# Patient Record
Sex: Female | Born: 1960 | Race: White | Hispanic: No | Marital: Married | Smoking: Never smoker
Health system: Southern US, Community
[De-identification: ages and names within clinical notes are randomized; demographics above are authoritative.]

## PROBLEM LIST (undated history)

## (undated) DIAGNOSIS — K635 Polyp of colon: Secondary | ICD-10-CM

## (undated) DIAGNOSIS — Z8719 Personal history of other diseases of the digestive system: Secondary | ICD-10-CM

## (undated) DIAGNOSIS — K219 Gastro-esophageal reflux disease without esophagitis: Secondary | ICD-10-CM

## (undated) DIAGNOSIS — K209 Esophagitis, unspecified without bleeding: Secondary | ICD-10-CM

## (undated) DIAGNOSIS — K9041 Non-celiac gluten sensitivity: Secondary | ICD-10-CM

## (undated) DIAGNOSIS — E079 Disorder of thyroid, unspecified: Secondary | ICD-10-CM

## (undated) DIAGNOSIS — M7540 Impingement syndrome of unspecified shoulder: Secondary | ICD-10-CM

## (undated) DIAGNOSIS — M1990 Unspecified osteoarthritis, unspecified site: Secondary | ICD-10-CM

## (undated) DIAGNOSIS — E039 Hypothyroidism, unspecified: Secondary | ICD-10-CM

## (undated) DIAGNOSIS — T7840XA Allergy, unspecified, initial encounter: Secondary | ICD-10-CM

## (undated) DIAGNOSIS — D649 Anemia, unspecified: Secondary | ICD-10-CM

## (undated) DIAGNOSIS — R87629 Unspecified abnormal cytological findings in specimens from vagina: Secondary | ICD-10-CM

## (undated) HISTORY — DX: Esophagitis, unspecified without bleeding: K20.90

## (undated) HISTORY — PX: COLONOSCOPY: SHX174

## (undated) HISTORY — DX: Unspecified abnormal cytological findings in specimens from vagina: R87.629

## (undated) HISTORY — DX: Allergy, unspecified, initial encounter: T78.40XA

## (undated) HISTORY — DX: Polyp of colon: K63.5

## (undated) HISTORY — PX: APPENDECTOMY: SHX54

## (undated) HISTORY — DX: Disorder of thyroid, unspecified: E07.9

## (undated) HISTORY — DX: Anemia, unspecified: D64.9

## (undated) HISTORY — DX: Non-celiac gluten sensitivity: K90.41

## (undated) HISTORY — DX: Impingement syndrome of unspecified shoulder: M75.40

## (undated) HISTORY — PX: OTHER SURGICAL HISTORY: SHX169

## (undated) HISTORY — DX: Esophagitis, unspecified: K20.9

---

## 1995-08-17 HISTORY — PX: DILATION AND CURETTAGE OF UTERUS: SHX78

## 1997-08-16 HISTORY — PX: OTHER SURGICAL HISTORY: SHX169

## 2003-11-27 ENCOUNTER — Other Ambulatory Visit: Admission: RE | Admit: 2003-11-27 | Discharge: 2003-11-27 | Payer: Self-pay | Admitting: Family Medicine

## 2003-12-03 ENCOUNTER — Emergency Department (HOSPITAL_COMMUNITY): Admission: EM | Admit: 2003-12-03 | Discharge: 2003-12-03 | Payer: Self-pay | Admitting: Emergency Medicine

## 2003-12-12 ENCOUNTER — Other Ambulatory Visit: Admission: RE | Admit: 2003-12-12 | Discharge: 2003-12-12 | Payer: Self-pay | Admitting: Obstetrics and Gynecology

## 2004-01-08 ENCOUNTER — Encounter (HOSPITAL_COMMUNITY): Admission: RE | Admit: 2004-01-08 | Discharge: 2004-02-07 | Payer: Self-pay | Admitting: Family Medicine

## 2004-01-15 ENCOUNTER — Encounter (INDEPENDENT_AMBULATORY_CARE_PROVIDER_SITE_OTHER): Payer: Self-pay | Admitting: *Deleted

## 2004-01-15 LAB — CONVERTED CEMR LAB

## 2004-02-11 ENCOUNTER — Encounter (HOSPITAL_COMMUNITY): Admission: RE | Admit: 2004-02-11 | Discharge: 2004-03-12 | Payer: Self-pay | Admitting: Family Medicine

## 2004-02-15 ENCOUNTER — Ambulatory Visit (HOSPITAL_COMMUNITY): Admission: RE | Admit: 2004-02-15 | Discharge: 2004-02-15 | Payer: Self-pay | Admitting: Orthopedic Surgery

## 2004-03-07 ENCOUNTER — Ambulatory Visit (HOSPITAL_COMMUNITY): Admission: RE | Admit: 2004-03-07 | Discharge: 2004-03-07 | Payer: Self-pay | Admitting: Orthopaedic Surgery

## 2004-05-04 ENCOUNTER — Encounter: Admission: RE | Admit: 2004-05-04 | Discharge: 2004-05-04 | Payer: Self-pay | Admitting: Orthopaedic Surgery

## 2004-05-09 ENCOUNTER — Emergency Department (HOSPITAL_COMMUNITY): Admission: EM | Admit: 2004-05-09 | Discharge: 2004-05-09 | Payer: Self-pay | Admitting: Emergency Medicine

## 2004-05-27 ENCOUNTER — Encounter
Admission: RE | Admit: 2004-05-27 | Discharge: 2004-05-27 | Payer: Self-pay | Admitting: Physical Medicine and Rehabilitation

## 2004-06-17 ENCOUNTER — Ambulatory Visit: Payer: Self-pay | Admitting: Family Medicine

## 2004-07-08 ENCOUNTER — Ambulatory Visit: Payer: Self-pay | Admitting: Internal Medicine

## 2004-07-13 ENCOUNTER — Ambulatory Visit: Payer: Self-pay | Admitting: Internal Medicine

## 2004-07-17 ENCOUNTER — Ambulatory Visit: Payer: Self-pay | Admitting: Family Medicine

## 2004-08-06 ENCOUNTER — Ambulatory Visit: Payer: Self-pay | Admitting: Sports Medicine

## 2004-08-20 ENCOUNTER — Ambulatory Visit: Payer: Self-pay | Admitting: Family Medicine

## 2004-10-06 ENCOUNTER — Ambulatory Visit: Payer: Self-pay | Admitting: Sports Medicine

## 2006-10-13 DIAGNOSIS — M545 Low back pain, unspecified: Secondary | ICD-10-CM | POA: Insufficient documentation

## 2006-10-13 DIAGNOSIS — K209 Esophagitis, unspecified without bleeding: Secondary | ICD-10-CM | POA: Insufficient documentation

## 2006-10-14 ENCOUNTER — Encounter (INDEPENDENT_AMBULATORY_CARE_PROVIDER_SITE_OTHER): Payer: Self-pay | Admitting: *Deleted

## 2008-04-01 ENCOUNTER — Ambulatory Visit: Payer: Self-pay | Admitting: Family Medicine

## 2008-04-01 DIAGNOSIS — Z862 Personal history of diseases of the blood and blood-forming organs and certain disorders involving the immune mechanism: Secondary | ICD-10-CM | POA: Insufficient documentation

## 2008-05-01 ENCOUNTER — Encounter (INDEPENDENT_AMBULATORY_CARE_PROVIDER_SITE_OTHER): Payer: Self-pay | Admitting: Family Medicine

## 2008-05-01 ENCOUNTER — Ambulatory Visit: Payer: Self-pay | Admitting: Family Medicine

## 2008-05-01 LAB — CONVERTED CEMR LAB
Cholesterol: 184 mg/dL (ref 0–200)
HCT: 39.2 % (ref 36.0–46.0)
HDL: 58 mg/dL (ref >39)
Hemoglobin: 12.1 g/dL (ref 12.0–15.0)
LDL Cholesterol: 105 mg/dL — ABNORMAL HIGH (ref 0–99)
MCHC: 30.9 g/dL (ref 30.0–36.0)
MCV: 79.7 fL (ref 78.0–100.0)
Platelets: 219 10*3/uL (ref 150–400)
RBC: 4.92 M/uL (ref 3.87–5.11)
RDW: 17.6 % — ABNORMAL HIGH (ref 11.5–15.5)
Total CHOL/HDL Ratio: 3.2
Triglycerides: 105 mg/dL (ref <150)
VLDL: 21 mg/dL (ref 0–40)
Vit D, 1,25-Dihydroxy: 34 (ref 30–89)
WBC: 5.3 10*3/uL (ref 4.0–10.5)

## 2008-05-03 ENCOUNTER — Encounter (INDEPENDENT_AMBULATORY_CARE_PROVIDER_SITE_OTHER): Payer: Self-pay | Admitting: *Deleted

## 2008-05-06 ENCOUNTER — Encounter (INDEPENDENT_AMBULATORY_CARE_PROVIDER_SITE_OTHER): Payer: Self-pay | Admitting: *Deleted

## 2008-06-11 ENCOUNTER — Ambulatory Visit: Payer: Self-pay | Admitting: Internal Medicine

## 2008-06-27 ENCOUNTER — Ambulatory Visit: Payer: Self-pay | Admitting: Internal Medicine

## 2008-06-27 ENCOUNTER — Encounter: Payer: Self-pay | Admitting: Internal Medicine

## 2008-06-28 ENCOUNTER — Encounter: Payer: Self-pay | Admitting: Internal Medicine

## 2008-07-23 ENCOUNTER — Ambulatory Visit (HOSPITAL_COMMUNITY): Admission: RE | Admit: 2008-07-23 | Discharge: 2008-07-23 | Payer: Self-pay | Admitting: Family Medicine

## 2010-03-23 ENCOUNTER — Ambulatory Visit (HOSPITAL_COMMUNITY): Admission: RE | Admit: 2010-03-23 | Discharge: 2010-03-23 | Payer: Self-pay | Admitting: Family Medicine

## 2010-09-05 ENCOUNTER — Encounter: Payer: Self-pay | Admitting: Physical Medicine and Rehabilitation

## 2010-09-15 NOTE — Letter (Signed)
Summary: Patient Notice- Polyp Results   Gastroenterology  [address] [place], [place] [address]   Phone: [phone]  Fax: [phone]        June 28, 2008 MRN: [id number]    Leah Cuevas [address] [place], [place]  [address]    Dear [name],  I am pleased to inform you that the colon polyp(s) removed during your recent colonoscopy was (were) found to be benign (no cancer detected) upon pathologic examination.The polyp was hyperplastic ( not precancerous)  I recommend you have a repeat colonoscopy examination in 7_ years to look for recurrent polyps, as having colon polyps increases your risk for having recurrent polyps or even colon cancer in the future.  Should you develop new or worsening symptoms of abdominal pain, bowel habit changes or bleeding from the rectum or bowels, please schedule an evaluation with either your primary care physician or with me.  Additional information/recommendations:  _x_ No further action with gastroenterology is needed at this time. Please      follow-up with your primary care physician for your other healthcare      needs.    Please call us if you are having persistent problems or have questions about your condition that have not been fully answered at this time.  Sincerely,  Hart Carwin MD  This letter has been electronically signed by your physician.

## 2010-09-15 NOTE — Letter (Signed)
Summary: Lipid Letter  [facility]  [address]   [place], [place] [address]   Phone: [phone]  Fax: [phone]    05/03/2008  Leah Cuevas [address] [place], [place]  [address]  Dear Leah Cuevas:  Your lab results from 05/01/08 are excellent. Your cholesterol looks great. The lab values are below. Your hemoglobin was 12.1 so you are not anemic and your vitamin D level was about 34 which is normal. Please call our office if you have any questions.      Cholesterol:       184     Goal: <200   HDL "good" Cholesterol:   58     Goal: >40   LDL "bad" Cholesterol:   105     Goal: <160   Triglycerides:       105     Goal: <150  Sincerely,    [facility] JENNIFER HOBBS MD  Appended Document: Lipid Letter Letter sent via mail to pt ..................Marland KitchenDelores Pate-Gaddy, CMA (AAMA)

## 2010-09-15 NOTE — Assessment & Plan Note (Signed)
Summary: pap smear    Vital Signs:  Patient Profile:   50 Years Old Female Height:     64.5 inches (163.83 cm) Weight:      130 pounds (59.09 kg) BMI:     22.05 Temp:     97.7 degrees F (36.50 degrees C) Pulse rate:   50 / minute BP sitting:   116 / 75  Pt. in pain?   no  Vitals Entered By: Jone Baseman CMA (May 01, 2008 11:26 AM)                 Flu Vaccine Next Due:  Refused Last TD:  Done. (08/16/1997 12:00:00 AM) TD Next Due:  Refused   PCP:  Victorino Dike HOBBS MD  Chief Complaint:  pap smear .  History of Present Illness: 50 yo female with hx degenerative disc disease, GERD, Depression who mainly chooses to use homeopathic medication here today for pap since unable to get at [facility].  Needs colonoscopy, mammogram, and pap. Now has [employer].   Family situation seems better. Not as stressed. Still living at friend's house in cam[place].  LMP was at last visit 04/01/08. Has irregular menses. Sometimes will go every 3 months. No heavy periods. Does not want STD testing.   Having some bad breath - Unable to see dentist because does not have insurance. Is going to try to go to dental clinic [facility].   Wants vitamin D level checked becuse she has been taking vitamin D supplements and feels like she may be taking too much.        Current Allergies: PENICILLIN      Physical Exam  General:     alert, well-developed, and well-nourished.   Abdomen:     soft and non-tender.   Genitalia:     Normal introitus for age, no external lesions, no vaginal discharge, mucosa pink and moist, no vaginal or cervical lesions, no vaginal atrophy, no friaility or hemorrhage, normal uterus size and position, no adnexal masses or tenderness    Impression & Recommendations:  Problem # 1:  SCREENING FOR MALIGNANT NEOPLASM(ICD-V76.2) Assessment: Unchanged pap done today.   Orders: Pap Smear- [facility] (Pap) [facility]- Est Level  3 ([address])   Problem # 2:  GYNECOLOGICAL  EXAMINATIONOUTINE (ICD-V72.31) Assessment: Unchanged  Problem # 3:  SCREENING FOR LIPOID DISORDERS (ICD-V77.91) Assessment: Unchanged Checking FLP.   Orders: Lipid-[facility] ([address]-32440)   Problem # 4:  Hx of ANEMIA, HX OF (ICD-V12.3) Assessment: Unchanged Check hemoglobin.   Orders: CBC-[facility] ([address]272)   Other Orders: Vit D, 25 OH-[facility] ([id number])    ]
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
## 2010-09-15 NOTE — Miscellaneous (Signed)
Summary: [facility] Previsit/prep  Clinical Lists Changes  Medications: Added new medication of COLYTE WITH FLAVOR PACKS 240 GM  SOLR (PEG 3350-KCL-NABCB-NACL-NASULF) As per prep instructions. - Signed Added new medication of DULCOLAX 5 MG  TBEC (BISACODYL) Day before procedure take 2 at 3pm and 2 at 8pm. - Signed Rx of COLYTE WITH FLAVOR PACKS 240 GM  SOLR (PEG 3350-KCL-NABCB-NACL-NASULF) As per prep instructions.;  #1 x 0;  Signed;  Entered by: Wyona Almas RN;  Authorized by: Hart Carwin MD;  Method used: [facility] to [facility] [phone]*, [address] [place], [place], [place]  [address], Ph: [phone] or [phone], Fax: [phone] Rx of DULCOLAX 5 MG  TBEC (BISACODYL) Day before procedure take 2 at 3pm and 2 at 8pm.;  #4 x 0;  Signed;  Entered by: Wyona Almas RN;  Authorized by: Hart Carwin MD;  Method used: Electronically to [facility] [phone]*, [address], [place], [place], [place]  [address], Ph: [phone] or [phone], Fax: [phone] Observations: Added new observation of ALLERGY REV: Done (06/11/2008 8:34)    Prescriptions: DULCOLAX 5 MG  TBEC (BISACODYL) Day before procedure take 2 at 3pm and 2 at 8pm.  #4 x 0   Entered by:   Wyona Almas RN   Authorized by:   Hart Carwin MD   Signed by:   Wyona Almas RN on 06/11/2008   Method used:   [facility]        [facility] [address] [phone]* (retail)       [address]       [place], [place]  [address]       Ph: [phone] or [phone]       Fax: [phone]   RxID:   [id number] COLYTE WITH FLAVOR PACKS 240 GM  SOLR (PEG 3350-KCL-NABCB-NACL-NASULF) As per prep instructions.  #1 x 0   Entered by:   Wyona Almas RN   Authorized by:   Hart Carwin MD   Signed by:   Wyona Almas RN on 06/11/2008   Method used:   Electronically to        [facility] [phone]* (retail)       [address]       [place], [place]  [address]       Ph:  [phone] or [phone]       Fax: [phone]   RxID:   [id number]

## 2010-09-15 NOTE — Procedures (Signed)
Summary: Colonoscopy   Colonoscopy  Procedure date:  06/27/2008  Findings:      Location:  [facility].    Procedures Next Due Date:    Colonoscopy: 07/2015  Patient Name: Leah, Cuevas. MRN:  Procedure Procedures: Colonoscopy CPT: [phone].  Personnel: Endoscopist: Kattia Selley L. Juanda Chance, MD.  Exam Location: Exam performed in Outpatient Clinic. Outpatient  Patient Consent: Procedure, Alternatives, Risks and Benefits discussed, consent obtained, from patient. Consent was obtained by the RN.  Indications  Surveillance of: Adenomatous Polyp(s). Initial polypectomy was performed in 2005. 1-2 Polyps were found at Index Exam. 2005.  History  Current Medications: Patient is not currently taking Coumadin.  Pre-Exam Physical: Performed Jul 13, 2004. Entire physical exam was normal.  Comments: Pt. history reviewed/updated, physical exam performed prior to initiation of sedation?yes Exam Exam: Extent of exam reached: Cecum, extent intended: Cecum.  The cecum was identified by appendiceal orifice and IC valve. Duration of exam: time in9:40 min, out 5:58 minj minutes. Colon retroflexion performed. Images taken. ASA Classification: I. Tolerance: good.  Monitoring: Pulse and BP monitoring, Oximetry used. Supplemental O2 given.  Colon Prep Used Halflytely for colon prep. Prep results: good.  Sedation Meds: Patient assessed and found to be appropriate for moderate (conscious) sedation. Fentanyl 100 mcg. given IV. Versed 10 mg. given IV.  Findings - NORMAL EXAM: Cecum.  POLYP: Sigmoid Colon, Maximum size: 5 mm. sessile polyp. Distance from Anus 20 cm. Procedure:  biopsy without cautery, The polyp was removed piece meal. removed, retrieved, Polyp sent to pathology. ICD9: Colon Polyps: 211.3.  - NORMAL EXAM: Rectum.   Assessment Abnormal examination, see findings above.  Diagnoses: 211.3: Colon Polyps.   Comments: diminutive sigmoid polyp removed from 20  cm Events  Unplanned Interventions: No intervention was required.  Unplanned Events: There were no complications. Plans Medication Plan: Await pathology.  Patient Education: Patient given standard instructions for: Patient instructed to get routine colonoscopy every 5-7 years.  Disposition: After procedure patient sent to recovery. After recovery patient sent home.    cc: Alanda Amass, MD   REPORT OF SURGICAL PATHOLOGY   Case #: [id number] Patient Name: Leah Cuevas, Leah Cuevas. Office Chart Number:  [id number]   MRN: [id number] Pathologist: Beulah Gandy. Luisa Hart, MD DOB/Age  11-16-1960 (Age: 50)    Gender: F Date Taken:  06/27/2008 Date Received: 06/27/2008   FINAL DIAGNOSIS   ***MICROSCOPIC EXAMINATION AND DIAGNOSIS***   COLON, 20 CM, POLYP:  HYPERPLASTIC POLYPS.  NO ADENOMATOUS CHANGE OR MALIGNANCY IDENTIFIED.   cc Date Reported:  06/28/2008     Beulah Gandy. Luisa Hart, MD   June 28, 2008 MRN: [id number]  [facility] [address] [place], [place]  [address]  Dear Ms. Morren,  I am pleased to inform you that the colon polyp(s) removed during your recent colonoscopy was (were) found to be benign (no cancer detected) upon pathologic examination.The polyp was hyperplastic ( not precancerous)  I recommend you have a repeat colonoscopy examination in 7_ years to look for recurrent polyps, as having colon polyps increases your risk for having recurrent polyps or even colon cancer in the future.  Should you develop new or worsening symptoms of abdominal pain, bowel habit changes or bleeding from the rectum or bowels, please schedule an evaluation with either your primary care physician or with me.  Additional information/recommendations:  _x_ No further action with gastroenterology is needed at this time. Please      follow-up with your primary care physician for your other healthcare  needs.   Please call us if you are having persistent problems or have  questions about your condition that have not been fully answered at this time.  Sincerely,  Hart Carwin MD  This report was created from the original endoscopy report, which was reviewed and signed by the above listed endoscopist.

## 2010-09-15 NOTE — Letter (Signed)
Summary: Generic Letter  [facility]  [address]   [place], [place] [address]   Phone: [phone]  Fax: [phone]    05/06/2008  Leah Cuevas [address] [place], [place]  [address]  Dear [name],  Your pap smear from 05/01/08 is normal.   Sincerely,   JENNIFER HOBBS MD [facility]  Appended Document: Generic Letter Letter sent to pt via mail    ............................[name],CMA (AAMA)

## 2010-10-22 ENCOUNTER — Ambulatory Visit (INDEPENDENT_AMBULATORY_CARE_PROVIDER_SITE_OTHER): Payer: Self-pay | Admitting: Family Medicine

## 2010-10-22 ENCOUNTER — Encounter: Payer: Self-pay | Admitting: Family Medicine

## 2010-10-22 VITALS — BP 113/71 | HR 69 | Temp 98.2°F | Ht 65.0 in | Wt 142.0 lb

## 2010-10-22 DIAGNOSIS — E039 Hypothyroidism, unspecified: Secondary | ICD-10-CM | POA: Insufficient documentation

## 2010-10-22 LAB — CONVERTED CEMR LAB
Free T4: 1.23 ng/dL (ref 0.80–1.80)
T3, Free: 2.6 pg/mL (ref 2.3–4.2)
TSH: 3.009 microintl units/mL (ref 0.350–4.500)

## 2010-10-22 LAB — T4, FREE: Free T4: 1.23 ng/dL (ref 0.80–1.80)

## 2010-10-22 LAB — T3, FREE: T3, Free: 2.6 pg/mL (ref 2.3–4.2)

## 2010-10-22 LAB — TSH: TSH: 3.009 u[IU]/mL (ref 0.350–4.500)

## 2010-10-22 MED ORDER — LEVOTHYROXINE SODIUM 88 MCG PO TABS: 88.0000 ug | ORAL_TABLET | Freq: Every day | ORAL | Status: DC

## 2010-10-22 NOTE — Assessment & Plan Note (Addendum)
Pt with a diagnosis of hypothyroidism by Dr. Delbert Harness up in [place].  She comes here because she wants a more natural approach to her thyroid treatment.  Most recent dose was .  Will cut that back to and see how she does.  Will check TSH, T3, and T4 today to see where she is at her baseline.  Recommended switching her to Armour Thyroid or NatureThroid if unable to wean.  Also encouraged her to consider cutting out gluten.  Will see her back in 6-8 weeks and recheck levels then.

## 2010-10-22 NOTE — Patient Instructions (Addendum)
It was nice to meet you today. I look forward to working with you. We will try and wean you off of the Synthroid medication. A couple more natural alternative include Armour Thyroid and Nature-Throid We may need to switch you to one of those if we can't wean you off of the thyroid supplementation Please schedule a follow up appointment in 6-8 weeks

## 2010-10-22 NOTE — Progress Notes (Signed)
  Subjective:    Patient ID: Leah Cuevas, female    DOB: 18-Apr-1961, 50 y.o.   MRN: [id number]  HPI 1. Hypothyroidism:  Pt has been followed by Dr. Delbert Harness in [place] but she wanted a more natural approach to her hypothyroidism.  She has been on Synthroid for years.  She would ultimately like to come off of the synthroid or switch to something more natural.  She has started taking a thyroid support capsule for the last 2 weeks.  She was most recently seen by Dr. Cecelia Byars and told that her dose was too high and her dose has been decreased.  She has also been taking the Synthroid only occasionally for the past 2 weeks.    She is a vegetarian   Review of Systems Endorses weight gain, LE edema, hair thinning.  Denies shortness of breath, chest pain    Objective:   Physical Exam  Constitutional: She is oriented to person, place, and time. She appears well-developed and well-nourished. No distress.  Eyes: Pupils are equal, round, and reactive to light.  Neck: Normal range of motion. Neck supple. No thyromegaly present.  Cardiovascular: Normal rate and regular rhythm.   No murmur heard. Pulmonary/Chest: Effort normal and breath sounds normal. No respiratory distress. She has no wheezes.  Abdominal: Soft. Bowel sounds are normal. She exhibits no distension. There is no tenderness.  Musculoskeletal: Normal range of motion. She exhibits no edema.  Neurological: She is alert and oriented to person, place, and time.  Skin: Skin is warm and dry.  Psychiatric: She has a normal mood and affect.          Assessment & Plan:

## 2010-10-23 ENCOUNTER — Telehealth: Payer: Self-pay | Admitting: Family Medicine

## 2010-10-23 MED ORDER — ARMOUR THYROID 60 MG PO TABS: 60.0000 mg | ORAL_TABLET | Freq: Every day | ORAL | Status: DC

## 2010-10-23 NOTE — Telephone Encounter (Signed)
Left message about lab results and that she will likely need some form of thyroid replacement.  Advised her to call back and discuss

## 2010-10-23 NOTE — Progress Notes (Signed)
  Subjective:    Patient ID: Leah Cuevas, female    DOB: 1960-11-28, 50 y.o.   MRN: [id number]  HPI  Discussed with patient lab results.  She would like to switch to Armour.    Review of Systems     Objective:   Physical Exam        Assessment & Plan:

## 2010-10-23 NOTE — Progress Notes (Signed)
Addended by: Angelena Sole on: 10/23/2010 04:56 PM   Modules accepted: Orders

## 2010-11-30 ENCOUNTER — Telehealth: Payer: Self-pay | Admitting: Family Medicine

## 2010-11-30 NOTE — Telephone Encounter (Signed)
Was given rx for Skelaxin and wants to know if there is an herbal alternative for this - does not want to fill rx until talking with Lelon Perla.  Please advise asap.

## 2010-12-02 NOTE — Telephone Encounter (Signed)
Spoke with patient.  She had an injury at work where she fell and hurt her knee.  According to her she now has intraarticular loose bodies and is having bad knee pain.  She was given a prescription for Skelaxin because of knee pain as an anti-inflammatory because she has stomach problems.  I advised her that Skelaxin is not a great anti-inflammatory.  She wanted to know if there was any natural alternatives.  I advised Tart Cherry Extract might help but that it is very different from Dynegy.  I also advised her that it would probably be fine for her to take it for a short period of time without problems.  She voiced her understanding.

## 2010-12-08 ENCOUNTER — Ambulatory Visit (INDEPENDENT_AMBULATORY_CARE_PROVIDER_SITE_OTHER): Payer: Self-pay | Admitting: Family Medicine

## 2010-12-08 ENCOUNTER — Encounter: Payer: Self-pay | Admitting: Family Medicine

## 2010-12-08 VITALS — BP 120/81 | HR 90 | Temp 98.2°F | Wt 142.2 lb

## 2010-12-08 DIAGNOSIS — K900 Celiac disease: Secondary | ICD-10-CM

## 2010-12-08 DIAGNOSIS — M25561 Pain in right knee: Secondary | ICD-10-CM | POA: Insufficient documentation

## 2010-12-08 DIAGNOSIS — M25569 Pain in unspecified knee: Secondary | ICD-10-CM

## 2010-12-08 DIAGNOSIS — K9041 Non-celiac gluten sensitivity: Secondary | ICD-10-CM

## 2010-12-08 DIAGNOSIS — E039 Hypothyroidism, unspecified: Secondary | ICD-10-CM

## 2010-12-08 LAB — TSH: TSH: 8.142 u[IU]/mL — ABNORMAL HIGH (ref 0.350–4.500)

## 2010-12-08 NOTE — Assessment & Plan Note (Signed)
Overall well being improved with being off of gluten.  Advised her to continue this diet.  She may also try a challenge test to confirm.

## 2010-12-08 NOTE — Assessment & Plan Note (Signed)
Last TSH slightly elevated.  Switched from Synthroid to [employer].  Will see how TSH has responded.

## 2010-12-08 NOTE — Patient Instructions (Signed)
Good to see you today Keep me updated with the worker's comp case.  I hope that your knee pain gets better. We will check your thyroid levels again today to see how your body is responding to the Armour Thyroid Please schedule a follow up appointment with me in June

## 2010-12-08 NOTE — Progress Notes (Signed)
  Subjective:    Patient ID: Leah Cuevas, female    DOB: 06-Jul-1961, 50 y.o.   MRN: [id number]  HPI  1. Hypothyroidism:  She has been taking the Armour thyroid as directed.  She has also been taking a Thyroid Activator supplement.  She used to be taking 4 of them when she was on Synthroid but now can only take 2 because otherwise she gets too wound up.  She feels that her energy is good.  Pleased with the change from Synthroid.  2. Right knee pain:  She was in an accident at work.  She twisted her knee.  She is plugged in with the [employer] and has been evaluated by their doctor.  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 Ophelia Charter with [facility].  She hasn't seen him yet.  She was prescribed some narcotics and muscle relaxers.  She hasn't been taking them but has instead been taking natural anti-inflammatories including (yucca, nopelea, and arnica gel).  This seems to keep her pain manageable  3. Gluten sensitivity:  She has recently cut out all gluten and has noticed an improvement in her energy and overall well being.  Review of Systems Denies weight loss, diarrhea.  Denies insomnia.  Denies headaches.  Denies hair thinning.    Objective:   Physical Exam  Constitutional: She is oriented to person, place, and time. She appears well-nourished. No distress.  Eyes: EOM are normal. Pupils are equal, round, and reactive to light.  Neck: Normal range of motion. Neck supple. No thyromegaly present.  Cardiovascular: Normal rate, regular rhythm and normal heart sounds.   No murmur heard. Pulmonary/Chest: Effort normal and breath sounds normal. No respiratory distress. She has no wheezes.  Abdominal: Soft. She exhibits no distension.  Musculoskeletal: She exhibits no edema.       Right knee in knee brace  Lymphadenopathy:    She has no cervical adenopathy.  Neurological: She is alert and oriented to person, place, and  time.  Skin: Skin is warm and dry.          Assessment & Plan:

## 2010-12-08 NOTE — Assessment & Plan Note (Signed)
Worker's Comp case.  Will defer further management to them.

## 2010-12-10 ENCOUNTER — Telehealth: Payer: Self-pay | Admitting: Family Medicine

## 2010-12-10 DIAGNOSIS — E039 Hypothyroidism, unspecified: Secondary | ICD-10-CM

## 2010-12-10 NOTE — Telephone Encounter (Signed)
Pt called back to say she will continue to stay with the current dose.

## 2010-12-10 NOTE — Telephone Encounter (Signed)
Left message about lab results.  Informed her that the TSH was a little high and that she might benefit from more of the Armour Thyroid.  However, she seemed to get a little wound up when she takes the Armour with the Thyroid activator that I would be fine keeping her at the same dose.  Advised her to call back and let me know which way she wants to go.

## 2011-02-09 ENCOUNTER — Ambulatory Visit (INDEPENDENT_AMBULATORY_CARE_PROVIDER_SITE_OTHER): Payer: Self-pay | Admitting: Family Medicine

## 2011-02-09 ENCOUNTER — Encounter: Payer: Self-pay | Admitting: Family Medicine

## 2011-02-09 VITALS — BP 109/70 | HR 69 | Temp 97.9°F | Wt 136.0 lb

## 2011-02-09 DIAGNOSIS — E039 Hypothyroidism, unspecified: Secondary | ICD-10-CM

## 2011-02-09 NOTE — Assessment & Plan Note (Addendum)
She is feeling great with the current medication.  Reviewed the TSH but she did not want to make any adjustments because she feels great.

## 2011-02-09 NOTE — Progress Notes (Signed)
  Subjective:    Patient ID: Leah Cuevas, female    DOB: 1960-08-19, 50 y.o.   MRN: [id number]  HPI  1. Hypothyroidism:  She feels great with the current dose.  She is taking Armour thyroid as directed.  She is still taking the Thyroid activator which also seems to help.  She has more recently started a detox supplement.  Review of Systems Denies heart racing, weight loss.  Denies cold intolerance.  Denies hair thinning.  Denies diarrhea / constipation    Objective:   Physical Exam  Constitutional: She is oriented to person, place, and time. She appears well-nourished. No distress.  Eyes: EOM are normal. Pupils are equal, round, and reactive to light.  Neck: Normal range of motion. Neck supple. No thyromegaly present.  Cardiovascular: Normal rate, regular rhythm and normal heart sounds.   No murmur heard. Pulmonary/Chest: Effort normal and breath sounds normal. No respiratory distress. She has no wheezes.  Abdominal: Soft. She exhibits no distension.  Musculoskeletal: She exhibits no edema.  Lymphadenopathy:    She has no cervical adenopathy.  Neurological: She is alert and oriented to person, place, and time.  Skin: Skin is warm and dry.          Assessment & Plan:

## 2011-02-09 NOTE — Patient Instructions (Signed)
I'm glad that you are feeling well I agree that we do not need to make any changes to your medicines Please schedule a follow up appointment with Dr. Katrinka Blazing

## 2011-02-22 ENCOUNTER — Other Ambulatory Visit: Payer: Self-pay | Admitting: Family Medicine

## 2011-02-22 ENCOUNTER — Other Ambulatory Visit (HOSPITAL_COMMUNITY): Payer: Self-pay | Admitting: *Deleted

## 2011-02-22 DIAGNOSIS — Z139 Encounter for screening, unspecified: Secondary | ICD-10-CM

## 2011-03-26 ENCOUNTER — Ambulatory Visit (HOSPITAL_COMMUNITY)
Admission: RE | Admit: 2011-03-26 | Discharge: 2011-03-26 | Disposition: A | Discharge status: Home / Self Care | Payer: Self-pay | Source: Ambulatory Visit | Attending: *Deleted | Admitting: *Deleted

## 2011-03-26 DIAGNOSIS — Z139 Encounter for screening, unspecified: Secondary | ICD-10-CM

## 2011-04-29 ENCOUNTER — Encounter: Payer: Self-pay | Admitting: Family Medicine

## 2011-04-29 ENCOUNTER — Ambulatory Visit (INDEPENDENT_AMBULATORY_CARE_PROVIDER_SITE_OTHER): Payer: Self-pay | Admitting: Family Medicine

## 2011-04-29 DIAGNOSIS — E039 Hypothyroidism, unspecified: Secondary | ICD-10-CM

## 2011-04-29 DIAGNOSIS — M79609 Pain in unspecified limb: Secondary | ICD-10-CM

## 2011-04-29 DIAGNOSIS — M79673 Pain in unspecified foot: Secondary | ICD-10-CM | POA: Insufficient documentation

## 2011-04-29 NOTE — Patient Instructions (Signed)
Nice to meet you. We will check her thyroid levels I will call you with the results. I am giving you a post op boot to wear. I when she to come back in 2-3 weeks and hopefully we can make you orthotics or followup with sports medicine and we can do it down there.

## 2011-04-30 LAB — TSH: TSH: 7.211 u[IU]/mL — ABNORMAL HIGH (ref 0.350–4.500)

## 2011-04-30 NOTE — Assessment & Plan Note (Signed)
At present moment likely has a stress injury due to improper foot wear and breakdown of arch.   Pt given post op boot for now to walk in, told to stay away from anti inflammatories, come back in 2 weeks and will make her some sports insoles to try and use for pain relief, if after that still not better than to come back and likely will need to do custom orthotics, may consider podiatrist if bunion keeps being a bother.

## 2011-04-30 NOTE — Progress Notes (Signed)
  Subjective:    Patient ID: Leah Cuevas, female    DOB: Nov 05, 1960, 50 y.o.   MRN: [id number]  HPI 50 yo female hx of hypothyroid problem, been on armour now and is doing well. Pt denies any fatigue or any tiredness out of the ordinary, denies any hair loss or swelling, pt denies any chest pain or weight changes or late either. Pt is happy with her regimen and does not want to make a change if not necessary.  Pt is also coming in with left foot pain.  States it was a insidious onset, not true injury, states the pain mostly hurts on the bottom of the foot states that it seems to be around the 2nd and 3rd toe most of the time, worse with weight bearing or walking, has not been wearing anything but her crocs to work of late.  Has hx of bunion on that side and is reason she wear large shoes. Pt denies any swelling or ankle pain, does not wake her up much at night but has the last 2 nights.      Review of Systems Denies fever, chills, nausea vomiting abdominal pain, dysuria, chest pain, shortness of breath dyspnea on exertion or numbness in extremities Past medical history, social, surgical and family history all reviewed.      Objective:   Physical Exam BP 119/75  Pulse 81  Wt 135 lb 9.6 oz (61.508 kg) General appearance: alert and cooperative Eyes: negative Throat: lips, mucosa, and tongue normal; teeth and gums normal Neck: no adenopathy, no carotid bruit, supple, symmetrical, trachea midline and thyroid not enlarged, symmetric, no tenderness/mass/nodules Lungs: clear to auscultation bilaterally Heart: regular rate and rhythm, S1, S2 normal, no murmur, click, rub or gallop Abdomen: soft, non-tender; bowel sounds normal; no masses,  no organomegaly Extremities: Pt left foot has lateral displacement of all toe, 1 MTP has large bunion medially, no skin breakdown, pt is tender to palpation on dorsal aspect of 2nd and 3rd toe and some on dorsal aspect as well, full ROM of ankle, no pain at  knee.  Pt though also has some dropping of the transverse arch on left compared to right.  Pulses: 2+ and symmetric    Assessment & Plan:

## 2011-04-30 NOTE — Assessment & Plan Note (Signed)
Pt is doing well with medicine would not like to increase it if she does not need to, will check TSH but likely near same level and pt doing well.

## 2011-05-06 ENCOUNTER — Telehealth: Payer: Self-pay | Admitting: Family Medicine

## 2011-05-06 DIAGNOSIS — E039 Hypothyroidism, unspecified: Secondary | ICD-10-CM

## 2011-05-06 NOTE — Telephone Encounter (Signed)
Leah Cuevas is calling back about a message from Dr. Katrinka Blazing on Armour Thyroid and she is calling back because she believe she needs to do that but she want to talk to Dr. Katrinka Blazing about what exactly she needs to do.

## 2011-05-07 MED ORDER — THYROID 90 MG PO TABS: 90.0000 mg | ORAL_TABLET | Freq: Every day | ORAL | Status: DC

## 2011-05-07 NOTE — Telephone Encounter (Signed)
Called patient back she was to go up on the medication at this time will go to Armour 90 mg and see how she responds we will recheck her TSH in 3 months.

## 2011-05-20 ENCOUNTER — Encounter: Payer: Self-pay | Admitting: Family Medicine

## 2011-05-20 ENCOUNTER — Ambulatory Visit (INDEPENDENT_AMBULATORY_CARE_PROVIDER_SITE_OTHER): Payer: Self-pay | Admitting: Family Medicine

## 2011-05-20 DIAGNOSIS — M79673 Pain in unspecified foot: Secondary | ICD-10-CM

## 2011-05-20 DIAGNOSIS — M79609 Pain in unspecified limb: Secondary | ICD-10-CM

## 2011-05-20 NOTE — Progress Notes (Signed)
  Subjective:    Patient ID: Leah Cuevas, female    DOB: 07/16/1961, 50 y.o.   MRN: [id number]  HPI The patient is returning for followup on her left foot pain. Patient was given a postop boot and a arch brace. Patient appears to be doing better with pain only about 3/10. Patient had tried to wear some shoes today but had too much pain still in the same area. Patient states that the pain is mostly in the lateral aspect of the left foot does have some over the fourth toe denies any swelling or erythema in the arch support has helped. Patient though still states that she does have some pain at the end of the day has had to take some arnica for the pain but otherwise can tolerate it.  Less than 2 patient did have increase in her armour to 90 mg since then she's been feeling much better does have an increased appetite but does also note she has increased energy   Review of Systems Denies any fevers chills abdominal pain shortness of breath or dyspnea on exertion   past medical surgical social and family history reviewed Objective:   Physical Exam General: No apparent distress Cardiovascular: Regular rate and rhythm no murmur Extremities: Pt left foot has lateral displacement of all toe, 1 MTP has large bunion medially, no skin breakdown, pt is tender to palpation on dorsal aspect of 2nd and 3rd toe and some on dorsal aspect as well, full ROM of ankle, no pain at knee.  Pt though also has some dropping of the transverse arch on left compared to right.  Pulses: 2+ and symmetric    Assessment & Plan:

## 2011-05-20 NOTE — Patient Instructions (Signed)
Patient given verbal instructions 

## 2011-05-20 NOTE — Assessment & Plan Note (Signed)
Patient was fitted with Gen. orthotics today that had the addition of a metatarsal pad bilaterally to help with the following of the transverse arch. Patient is to continue using the arch brace and to get into her main new balance shoes. Patient will followup as needed but likely this will help patient given some exercises to do at home to help with a longitudinal arch if he continues to be a problem patient should be seen in sports medicine for further evaluation for potential for custom orthotics

## 2011-05-24 ENCOUNTER — Telehealth: Payer: Self-pay | Admitting: Family Medicine

## 2011-05-24 NOTE — Telephone Encounter (Signed)
Is asking if he know of any homopathic alternative for zyrtec

## 2011-05-24 NOTE — Telephone Encounter (Signed)
quercetin Or butterbur.  Either or unable to overdose per reports told her this is not my expertise but discussed with two functional/integrative medicine physicians.

## 2011-06-11 ENCOUNTER — Other Ambulatory Visit: Payer: Self-pay | Admitting: *Deleted

## 2011-06-11 MED ORDER — THYROID 90 MG PO TABS: 90.0000 mg | ORAL_TABLET | Freq: Every day | ORAL | Status: DC

## 2011-06-11 NOTE — Telephone Encounter (Signed)
Pt stated that she called [facility] for refill request however it was for 3 mo supply and she stated that she cannot afford this and it should be only for 1 month.Laureen Ochs, Viann Shove

## 2011-06-11 NOTE — Telephone Encounter (Signed)
Refill resent with #[phone]

## 2011-08-17 HISTORY — PX: HAMMER TOE SURGERY: SHX385

## 2011-09-09 ENCOUNTER — Encounter: Payer: Self-pay | Admitting: Family Medicine

## 2011-09-09 ENCOUNTER — Ambulatory Visit (INDEPENDENT_AMBULATORY_CARE_PROVIDER_SITE_OTHER): Payer: Self-pay | Admitting: Family Medicine

## 2011-09-09 VITALS — BP 137/47 | HR 55 | Temp 98.0°F | Ht 65.0 in | Wt 144.2 lb

## 2011-09-09 DIAGNOSIS — M79609 Pain in unspecified limb: Secondary | ICD-10-CM

## 2011-09-09 DIAGNOSIS — M79673 Pain in unspecified foot: Secondary | ICD-10-CM

## 2011-09-09 DIAGNOSIS — E039 Hypothyroidism, unspecified: Secondary | ICD-10-CM

## 2011-09-09 DIAGNOSIS — M67919 Unspecified disorder of synovium and tendon, unspecified shoulder: Secondary | ICD-10-CM

## 2011-09-09 DIAGNOSIS — M7540 Impingement syndrome of unspecified shoulder: Secondary | ICD-10-CM

## 2011-09-09 NOTE — Patient Instructions (Signed)
Is good to see you. I will check a thyroid to make sure if still doing well and I will call you with the results. I'm giving you some exercises to do for your shoulder but did not start until next week. Increase the Arnica topical as well as the devils claw to 2-3 times a day for the next week and then go back to the regular amount. Leah Cuevas see you again in 3-4 months. We should consider doing a Pap smear at that time.   Surgery for Rotator Cuff Tear with Rehab Rotator cuff surgery is only recommended for individuals who have experienced persistent disability for greater than 3 months of non-surgical (conservative) treatment. Surgery is not necessary but is recommended for individuals who experience difficulty completing daily activities or athletes who are unable to compete. Rotator cuff tears do not usually heal without surgical intervention. If left alone small rotator cuff tears usually become larger. Younger athletes who have a rotator cuff tear may be recommended for surgery without attempting conservative rehabilitation. The purpose of surgery is to regain function of the shoulder joint and eliminate pain associated with the injury. In addition to repairing the tendon tear, the surgery often removes a portion of the bony roof of the shoulder (acromion) as well as the chronically thickened and inflamed membrane below the acromion (subacromial bursa). REASONS NOT TO OPERATE   Infection of the shoulder.   Inability to complete a rehabilitation program.   Patients who have other conditions (emotional or psychological) conditions that contribute to their shoulder condition.  RISKS AND COMPLICATIONS  Infection.   Re-tear of the rotator cuff tendons or muscles.   Shoulder stiffness and/or weakness.   Inability to compete in athletics.   Acromioclavicular (AC) joint paint.   Risks of surgery: infection, bleeding, nerve damage, or damage to surrounding tissues.  TECHNIQUE There are different  surgical procedures used to treat rotator cuff tears. The type of procedure depends on the extent of injury as well as the surgeon's preference. All of the surgical techniques for rotator cuff tears have the same goal of repairing the torn tendon, removing part of the acromion, and removing the subacromial bursa. There are two main types of procedures: arthroscopic and open incision. Arthroscopic procedures are usually completed and you go home the same day as surgery (outpatient). These procedures use multiple small incisions in which tools and a video camera are placed to work on the shoulder. An electric shaver removes the bursa, then a power burr shaves down the portion of the acromion that places pressure on the rotator cuff. Finally the rotator cuff is sewed (sutured) back to the humeral head. Open incision procedures require a larger incision. The deltoid muscle is detached from the acromion and a ligament in the shoulder (coracoacromial) is cut in order for the surgeon to access the rotator cuff. The subacromial bursa is removed as well as part of the acromion to give the rotator cuff room to move freely. The torn tendon is then sutured to the humeral head. After the rotator cuff is repaired, then the deltoid is reattached and the incision is closed up.  RECOVERY   Post-operative care depends on the surgical technique and the preferences of your therapist.   Keep the wound clean and dry for the first 10 to 14 days after surgery.   Keep your shoulder and arm in the sling provided to you for as long as you have been instructed to.   You will be given pain medications  by your caregiver.   Passive (without using muscles) shoulder movements may be begun immediately after surgery.   It is important to follow through with you rehabilitation program in order to have the best possible recovery.  RETURN TO SPORTS   The rehabilitation period will depend on the sport and position you play as well as  the success of the operation.   The minimum recovery period is 6 months.   You must have regained complete shoulder motion and strength before returning to sports.  SEEK IMMEDIATE MEDICAL CARE IF:   Any medications produce adverse side effects.   Any complications from surgery occur:   Pain, numbness, or coldness in the extremity operated upon.   Discoloration of the nail beds (they become blue or gray) of the extremity operated upon.   Signs of infections (fever, pain, inflammation, redness, or persistent bleeding).  EXERCISES  RANGE OF MOTION (ROM) AND STRETCHING EXERCISES - Rotator Cuff Tear, Surgery For These exercises may help you restore your elbow mobility once your physician has discontinued your immobilization period. Beginning these before your provider's approval may result in delayed healing. Your symptoms may resolve with or without further involvement from your physician, physical therapist or athletic trainer. While completing these exercises, remember:   Restoring tissue flexibility helps normal motion to return to the joints. This allows healthier, less painful movement and activity.   An effective stretch should be held for at least 30 seconds. A stretch should never be painful. You should only feel a gentle lengthening or release in the stretch.  ROM - Pendulum   Bend at the waist so that your right / left arm falls away from your body. Support yourself with your opposite hand on a solid surface, such as a table or a countertop.   Your right / left arm should be perpendicular to the ground. If it is not perpendicular, you need to lean over farther. Relax the muscles in your right / left arm and shoulder as much as possible.   Gently sway your hips and trunk so they move your right / left arm without any use of your right / left shoulder muscles.   Progress your movements so that your right / left arm moves side to side, then forward and backward, and finally, both  clockwise and counterclockwise.   Complete __________ repetitions in each direction. Many people use this exercise to relieve discomfort in their shoulder as well as to gain range of motion.  Repeat __________ times. Complete this exercise __________ times per day. STRETCH - Flexion, Seated   Sit in a firm chair so that your right / left forearm can rest on a table or on a table or countertop. Your right / left elbow should rest below the height of your shoulder so that your shoulder feels supported and not tense or uncomfortable.   Keeping your right / left shoulder relaxed, lean forward at your waist, allowing your right / left hand to slide forward. Bend forward until you feel a moderate stretch in your shoulder, but before you feel an increase in your pain.   Hold __________ seconds. Slowly return to your starting position.  Repeat __________ times. Complete this exercise __________ times per day.  STRETCH - Flexion, Standing   Stand with good posture. With an underhand grip on your right / left and an overhand grip on the opposite hand, grasp a broomstick or cane so that your hands are a little more than shoulder-width apart.  Keeping your right / left elbow straight and shoulder muscles relaxed, push the stick with your opposite hand to raise your right / left arm in front of your body and then overhead. Raise your arm until you feel a stretch in your right / left shoulder, but before you have increased shoulder pain.   Try to avoid shrugging your right / left shoulder as your arm rises by keeping your shoulder blade tucked down and toward your mid-back spine. Hold __________ seconds.   Slowly return to the starting position.  Repeat __________ times. Complete this exercise __________ times per day.  STRETCH - Abduction, Supine   Stand with good posture. With an underhand grip on your right / left and an overhand grip on the opposite hand, grasp a broomstick or cane so that your hands  are a little more than shoulder-width apart.   Keeping your right / left elbow straight and shoulder muscles relaxed, push the stick with your opposite hand to raise your right / left arm out to the side of your body and then overhead. Raise your arm until you feel a stretch in your right / left shoulder, but before you have increased shoulder pain.   Try to avoid shrugging your right / left shoulder as your arm rises by keeping your shoulder blade tucked down and toward your mid-back spine. Hold __________ seconds.   Slowly return to the starting position.  Repeat __________ times. Complete this exercise __________ times per day.  ROM - Flexion, Active-Assisted  Lie on your back. You may bend your knees for comfort.   Grasp a broomstick or cane so your hands are about shoulder-width apart. Your right / left hand should grip the end of the stick/cane so that your hand is positioned "thumbs-up," as if you were about to shake hands.   Using your healthy arm to lead, raise your right / left arm overhead until you feel a gentle stretch in your shoulder. Hold __________ seconds.   Use the stick/cane to assist in returning your right / left arm to its starting position.  Repeat __________ times. Complete this exercise __________ times per day.  STRETCH - External Rotation   Tuck a folded towel or small ball under your right / left upper arm. Grasp a broomstick or cane with an underhand grasp a little more than shoulder width apart. Bend your elbows to 90 degrees.   Stand with good posture or sit in a chair without arms.   Use your strong arm to push the stick across your body. Do not allow the towel or ball to fall. This will rotate your right / left arm away from your abdomen. Using the stick turn/rotate your hand and forearm away from your body. Hold __________ seconds.  Repeat __________ times. Complete this exercise __________ times per day.  STRENGTHENING EXERCISES - Rotator Cuff Tear,  Surgery For These exercises may help you begin to restore your elbow strength in the initial stage of your rehabilitation. Your physician will determine when you begin these exercises depending on the severity of your injury and the integrity of your repaired tissues. Beginning these before your provider's approval may result in delayed healing. While completing these exercises, remember:   Muscles can gain both the endurance and the strength needed for everyday activities through controlled exercises.   Complete these exercises as instructed by your physician, physical therapist or athletic trainer. Progress the resistance and repetitions only as guided.   You may experience muscle soreness  or fatigue, but the pain or discomfort you are trying to eliminate should never worsen during these exercises. If this pain does worsen, stop and make certain you are following the directions exactly. If the pain is still present after adjustments, discontinue the exercise until you can discuss the trouble with your clinician.  STRENGTH - Shoulder Flexion, Isometric   With good posture and facing a wall, stand or sit about 4-6 inches away.   Keeping your right / left elbow straight, gently press the top of your fist into the wall. Increase the pressure gradually until you are pressing as hard as you can without shrugging your shoulder or increasing any shoulder discomfort.   Hold __________ seconds. Release the tension slowly. Relax your shoulder muscles completely before you do the next repetition.  Repeat __________ times. Complete this exercise __________ times per day.  STRENGTH - Shoulder Abductors, Isometric   With good posture, stand or sit about 4-6 inches from a wall with your right / left side facing the wall.   Bend your right / left elbow. Gently press your right / left elbow into the wall. Increase the pressure gradually until you are pressing as hard as you can without shrugging your shoulder or  increasing any shoulder discomfort.   Hold __________ seconds.   Release the tension slowly. Relax your shoulder muscles completely before you do the next repetition.  Repeat __________ times. Complete this exercise __________ times per day.  STRENGTH - Internal Rotators, Isometric   Keep your right / left elbow at your side and bend it 90 degrees.   Step into a door frame so that the inside of your right / left wrist can press against the door frame without your upper arm leaving your side.   Gently press your right / left wrist into the door frame as if you were trying to draw the palm of your hand to your abdomen. Gradually increase the tension until you are pressing as hard as you can without shrugging your shoulder or increasing any shoulder discomfort.   Hold __________ seconds.   Release the tension slowly. Relax your shoulder muscles completely before you do the next repetition.  Repeat __________ times. Complete this exercise __________ times per day.  STRENGTH - External Rotators, Isometric   Keep your right / left elbow at your side and bend it 90 degrees.   Step into a door frame so that the outside of your right / left wrist can press against the door frame without your upper arm leaving your side.   Gently press your right / left wrist into the door frame as if you were trying to swing the back of your hand away from your abdomen. Gradually increase the tension until you are pressing as hard as you can without shrugging your shoulder or increasing any shoulder discomfort.   Hold __________ seconds.   Release the tension slowly. Relax your shoulder muscles completely before you do the next repetition.  Repeat __________ times. Complete this exercise __________ times per day.  Document Released: 08/02/2005 Document Revised: 04/14/2011 Document Reviewed: 11/14/2008 [facility] Patient Information 2012 [place], [place].

## 2011-09-10 DIAGNOSIS — M7540 Impingement syndrome of unspecified shoulder: Secondary | ICD-10-CM | POA: Insufficient documentation

## 2011-09-10 LAB — TSH: TSH: 2.118 u[IU]/mL (ref 0.350–4.500)

## 2011-09-10 LAB — T4, FREE: Free T4: 0.93 ng/dL (ref 0.80–1.80)

## 2011-09-10 NOTE — Assessment & Plan Note (Signed)
Lab Results  Component Value Date   TSH 2.118 09/09/2011   patient treadmill will continue her more at the same dosage. Recheck in 3-4 months

## 2011-09-10 NOTE — Assessment & Plan Note (Signed)
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 Patient now a metatarsal pads does not have the money for custom orthotics which I think would be actually the main most helpful treatment. Told patient about Spenco orthotics and the potential of getting them on line or add omega sports. Discussed proper shoe size given exercises to do. If patient continues to have problems will need to consider doing Neurontin or nortriptyline the patient is wanting to keep most treatments holistic.

## 2011-09-10 NOTE — Progress Notes (Signed)
  Subjective:    Patient ID: Leah Cuevas, female    DOB: [birth year], 51 y.o.   MRN: [id number]  HPI  51 yo female hx of hypothyroid problem, been on armour now and is doing well. Pt denies any fatigue, denies any hair loss or swelling of extremities, pt denies any chest pain or weight changes. Pt is happy with her regimen and does not want to make a change if not necessary.  Pt is also bilateral foot pain.  States it was a insidious onset, not true injury, states the pain mostly hurts on the bottom of the foot states that it seems to be around the 2nd and 3rd toe most of the time. Patient states that it occurs when she's standing for long amount of time hurts more from the distal part from her foot to the toes. Patient was given metatarsal pads last time but did not wear them due to being uncomfortable. Has hx of bunion on that side and is reason she wear large shoes. Pt denies any swelling or ankle pain, does not wake her up much at night but has the last 2 nights.  Unable to afford custom orthotics.   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 Patient states mostly a dull ache no radiation of the pain no pain when she turns her neck only when she lifts her left arm. No numbness or weakness of the extremity no swelling of the extremity. Patient does not remember any injury still able to do most activities of daily living including working.  Review of Systems Denies fever, chills, nausea vomiting abdominal pain, dysuria, chest pain, shortness of breath dyspnea on exertion or numbness in extremities Past medical history, social, surgical and family history all reviewed.     Objective:   Physical Exam  BP 137/47  Pulse 55  Temp(Src) 98 F (36.7 C) (Oral)  Ht 5\' 5"  (1.651 m)  Wt 144 lb 3.2 oz (65.409 kg)  BMI 24.00 kg/m2 General  appearance: alert and cooperative Eyes: negative Throat: lips, mucosa, and tongue normal; teeth and gums normal Neck: no adenopathy, no carotid bruit, supple, symmetrical, trachea midline and thyroid not enlarged, symmetric, no tenderness/mass/nodules Lungs: clear to auscultation bilaterally Heart: regular rate and rhythm, S1, S2 normal, no murmur, click, rub or gallop Abdomen: soft, non-tender; bowel sounds normal; no masses,  no organomegaly Extremities: Pt left foot has lateral displacement of all toe, 1 MTP has large bunion medially, no skin breakdown, pt is tender to palpation on dorsal aspect of 2nd and 3rd toe and some on dorsal aspect as well, full ROM of ankle, no pain at knee.  Pt though also has some dropping of the transverse arch bilaterally.   Shoulder: Inspection reveals no abnormalities, atrophy or asymmetry. Palpation is normal with no tenderness over AC joint or bicipital groove. ROM is full in all planes. Rotator cuff strength weak on left 4/5 + Neer and Hawkin's tests, empty can. Speeds and Yergason's tests normal. No labral pathology noted with negative Obrien's, negative clunk and good stability. Normal scapular function observed. No painful arc and no drop arm sign. No apprehension sign  Pulses: 2+ and symmetric    Assessment & Plan:

## 2011-09-10 NOTE — Assessment & Plan Note (Signed)
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 Patient given numerous potential treatment options including injection today which patient declined. She would like to do this with over-the-counter medications as possible. Attempted have patient take ibuprofen such as a burst for 3 days the patient declined patient will continue her Arnica that she uses on a regular basis the we'll double the dose discussed about topical capsaicin and given exercises for patient to start in approximately 72 hours. Patient though is return for reevaluation in 4 weeks with the potential for any other type of injection or imaging.

## 2011-12-09 ENCOUNTER — Ambulatory Visit (INDEPENDENT_AMBULATORY_CARE_PROVIDER_SITE_OTHER): Payer: Self-pay | Admitting: Family Medicine

## 2011-12-09 ENCOUNTER — Other Ambulatory Visit (HOSPITAL_COMMUNITY)
Admission: RE | Admit: 2011-12-09 | Discharge: 2011-12-09 | Disposition: A | Discharge status: Home / Self Care | Payer: Self-pay | Source: Ambulatory Visit | Attending: Family Medicine | Admitting: Family Medicine

## 2011-12-09 ENCOUNTER — Encounter: Payer: Self-pay | Admitting: Family Medicine

## 2011-12-09 VITALS — BP 106/71 | HR 82 | Temp 98.8°F | Ht 65.0 in | Wt 145.0 lb

## 2011-12-09 DIAGNOSIS — Z124 Encounter for screening for malignant neoplasm of cervix: Secondary | ICD-10-CM

## 2011-12-09 DIAGNOSIS — D126 Benign neoplasm of colon, unspecified: Secondary | ICD-10-CM

## 2011-12-09 DIAGNOSIS — M79609 Pain in unspecified limb: Secondary | ICD-10-CM

## 2011-12-09 DIAGNOSIS — K635 Polyp of colon: Secondary | ICD-10-CM | POA: Insufficient documentation

## 2011-12-09 DIAGNOSIS — E039 Hypothyroidism, unspecified: Secondary | ICD-10-CM

## 2011-12-09 DIAGNOSIS — M79673 Pain in unspecified foot: Secondary | ICD-10-CM

## 2011-12-09 DIAGNOSIS — Z01419 Encounter for gynecological examination (general) (routine) without abnormal findings: Secondary | ICD-10-CM | POA: Insufficient documentation

## 2011-12-09 DIAGNOSIS — E559 Vitamin D deficiency, unspecified: Secondary | ICD-10-CM

## 2011-12-09 NOTE — Assessment & Plan Note (Signed)
Improved, well controlled we'll make no changes encourage patient to continue exercises.

## 2011-12-09 NOTE — Assessment & Plan Note (Signed)
We'll check thyroid today. Patient likely will need another TSH checked in 6 months. Patient will followup at that time or sooner if necessary. We'll call if need to augment care at all. Patient is under the understanding that we will not call if labs are normal.

## 2011-12-09 NOTE — Progress Notes (Signed)
  Subjective:    Patient ID: Leah Cuevas, female    DOB: 06/06/1961, 51 y.o.   MRN: [id number]  HPI  51 yo female hx of hypothyroid problem, been on armour now and is doing well. Pt denies any fatigue, denies any hair loss or swelling of extremities, pt denies any chest pain or weight changes. Pt is happy with her regimen and does not want to make a change if not necessary.  Pt is also bilateral foot pain.  Patient has improved tremendously with the arch supports and some in the exercises. Patient still says she has some pain dated day but does not stop her from any of her regular activities of daily living. Patient denies any numbness any worsening pain or any swelling of the ordinary.  Patient is due for Pap smear today. Patient is under the impression she may be due for colonoscopy as well do to a history of a hyperplastic polyps. Review of Systems Denies fever, chills, nausea vomiting abdominal pain, dysuria, chest pain, shortness of breath dyspnea on exertion or numbness in extremities Past medical history, social, surgical and family history all reviewed.     Objective:   Physical Exam  BP 106/71  Pulse 82  Temp(Src) 98.8 F (37.1 C) (Oral)  Ht 5\' 5"  (1.651 m)  Wt 145 lb (65.772 kg)  BMI 24.13 kg/m2 General appearance: alert and cooperative Eyes: negative Throat: lips, mucosa, and tongue normal; teeth and gums normal Neck: no adenopathy, no carotid bruit, supple, symmetrical, trachea midline and thyroid not enlarged, symmetric, no tenderness/mass/nodules Lungs: clear to auscultation bilaterally Heart: regular rate and rhythm, S1, S2 normal, no murmur, click, rub or gallop Abdomen: soft, non-tender; bowel sounds normal; no masses,  no organomegaly Extremities: Pt left foot has lateral displacement of all toe, 1 MTP has large bunion medially, no skin breakdown, no changes from previous visit. Pt though also has some dropping of the transverse arch bilaterally.   Pulses: 2+  and symmetric Pelvic exam: Patient does have some mild vaginal atrophy cervix seen appears to be normal Pap smear collected today.    Assessment & Plan:

## 2011-12-09 NOTE — Assessment & Plan Note (Signed)
Patient is a history of this but no records. We'll check a vitamin D level to check if accurate.

## 2011-12-09 NOTE — Patient Instructions (Addendum)
It is great to see you. I will get labs today including your cholesterol panel and thyroid. If normal we'll likely consider about 6 months. Corinda Gubler states that he did not need to be seen til 2016.

## 2011-12-10 LAB — LIPID PANEL
Cholesterol: 147 mg/dL (ref 0–200)
HDL: 50 mg/dL (ref >39)
LDL Cholesterol: 72 mg/dL (ref 0–99)
Total CHOL/HDL Ratio: 2.9 Ratio
Triglycerides: 126 mg/dL (ref <150)
VLDL: 25 mg/dL (ref 0–40)

## 2011-12-10 LAB — TSH: TSH: 13.242 u[IU]/mL — ABNORMAL HIGH (ref 0.350–4.500)

## 2011-12-10 LAB — VITAMIN D 25 HYDROXY (VIT D DEFICIENCY, FRACTURES): Vit D, 25-Hydroxy: 52 ng/mL (ref 30–89)

## 2011-12-13 ENCOUNTER — Telehealth: Payer: Self-pay | Admitting: Family Medicine

## 2011-12-13 ENCOUNTER — Telehealth: Payer: Self-pay | Admitting: *Deleted

## 2011-12-13 MED ORDER — THYROID 113.75 MG PO TABS: 113.7500 mg | ORAL_TABLET | Freq: Every day | ORAL | Status: DC

## 2011-12-13 MED ORDER — THYROID 113.75 MG PO TABS: 120.0000 mg | ORAL_TABLET | Freq: Every day | ORAL | Status: DC

## 2011-12-13 NOTE — Telephone Encounter (Signed)
Called pt back and told her we will increase medicine.

## 2011-12-13 NOTE — Telephone Encounter (Signed)
Patient attempted to call Dr. Katrinka Blazing back.  She will try again at her next break.

## 2011-12-13 NOTE — Telephone Encounter (Signed)
Attempted to call pt to tell her that we need to increase her thyroid medication. Unable to get a hold of her. We'll try again later.

## 2011-12-13 NOTE — Telephone Encounter (Signed)
Raquel at [facility] calling to verify increased dose of Armour Thyroid from 90mg  to 113.75mg .  Pharmacy only has 120mg .  Informed pharmacy that dose was increased today and ok for patient to have 120mg  per Dr. Katrinka Blazing.  Gaylene Brooks, RN

## 2011-12-13 NOTE — Telephone Encounter (Signed)
Addended by: Judi Saa on: 12/13/2011 12:14 PM   Modules accepted: Orders

## 2012-06-23 ENCOUNTER — Ambulatory Visit (INDEPENDENT_AMBULATORY_CARE_PROVIDER_SITE_OTHER): Payer: Self-pay | Admitting: Family Medicine

## 2012-06-23 ENCOUNTER — Encounter: Payer: Self-pay | Admitting: Family Medicine

## 2012-06-23 VITALS — BP 111/65 | HR 68 | Temp 98.5°F | Ht 65.0 in | Wt 149.0 lb

## 2012-06-23 DIAGNOSIS — E039 Hypothyroidism, unspecified: Secondary | ICD-10-CM

## 2012-06-23 NOTE — Progress Notes (Signed)
  Subjective:    Patient ID: Leah Cuevas, female    DOB: April 30, 1961, 51 y.o.   MRN: [id number]  HPI Patient is a 51 you female who presents for f/u of hypothyroidism.  Overall doing well.  States hasn't taken armour thyroid in 8 days.  Is on 120 mg tabs of armour thyroid.  Denies fatigue, constipation, slowed thought process, hair loss, cool skin, and periorbital edema.  Does endorse some weight gain and is up 4 lbs from last visit.  Also endorses some cold intolerance recently. Last TSH was 13.242    Review of Systems Negative except per HPI.     Objective:   Physical Exam  Constitutional: She appears well-developed and well-nourished.  HENT:  Head: Normocephalic and atraumatic.  Eyes: Conjunctivae normal are normal. Pupils are equal, round, and reactive to light.  Neck: No thyromegaly present.  Cardiovascular: Normal rate, regular rhythm and normal heart sounds.  Exam reveals no gallop and no friction rub.   No murmur heard. Pulmonary/Chest: Effort normal and breath sounds normal.  Skin: Skin is warm and dry.   BP 111/65  Pulse 68  Temp 98.5 F (36.9 C) (Oral)  Ht 5\' 5"  (1.651 m)  Wt 149 lb (67.586 kg)  BMI 24.79 kg/m2     Assessment & Plan:

## 2012-06-23 NOTE — Assessment & Plan Note (Signed)
Patient with hypothyroidism diagnosed 3 years ago. Has been on armour thyroid 120 mg daily due to wanting more natural treatment.  This has been working well for her. Plan: will check TSH today. Will call patient with results on Monday.  Prescription will be sent in at that time and will be adjusted accordingly.  Patient requests 30 day supply.

## 2012-06-23 NOTE — Patient Instructions (Signed)
Nice to meet you today. We will check a TSH level to see how your thyroid is doing. I will call you on Monday with these results and will send a prescription for armour thyroid at that time. Thank you for your visit today.

## 2012-06-24 LAB — TSH: TSH: 23.088 u[IU]/mL — ABNORMAL HIGH (ref 0.350–4.500)

## 2012-06-26 ENCOUNTER — Telehealth: Payer: Self-pay | Admitting: Family Medicine

## 2012-06-26 MED ORDER — LEVOTHYROXINE SODIUM 112 MCG PO TABS: 112.0000 ug | ORAL_TABLET | Freq: Every day | ORAL | Status: DC

## 2012-06-26 NOTE — Telephone Encounter (Signed)
Spoke with patient regarding elevated TSH level and the need to switch to synthroid from armour thyroid.  Advised that synthroid has a more standardized preparation so she would get more consistent levels of thyroid hormone with synthroid. Also advised that the armour thyroid was not working adequately given elevated TSH. Patient was agreeable to starting synthroid and will send prescription to pharmacy.

## 2012-10-26 ENCOUNTER — Telehealth: Payer: Self-pay | Admitting: *Deleted

## 2012-10-26 NOTE — Telephone Encounter (Signed)
Received fax from [employer] requesting manufactor change from [employer] to [employer] for levothyroxine. Per Dr. Gwendolyn Grant ,OK to make change and pharmacy notified.

## 2013-02-12 ENCOUNTER — Other Ambulatory Visit: Payer: Self-pay | Admitting: Family Medicine

## 2013-02-12 NOTE — Telephone Encounter (Signed)
Refilled patients medication. Will need follow-up in about 5 months prior to additional refills.

## 2013-06-27 ENCOUNTER — Ambulatory Visit (INDEPENDENT_AMBULATORY_CARE_PROVIDER_SITE_OTHER): Payer: PRIVATE HEALTH INSURANCE | Admitting: Family Medicine

## 2013-06-27 ENCOUNTER — Encounter: Payer: Self-pay | Admitting: Family Medicine

## 2013-06-27 VITALS — BP 121/55 | HR 63 | Temp 98.4°F | Ht 65.0 in | Wt 133.0 lb

## 2013-06-27 DIAGNOSIS — Z0000 Encounter for general adult medical examination without abnormal findings: Secondary | ICD-10-CM

## 2013-06-27 DIAGNOSIS — E039 Hypothyroidism, unspecified: Secondary | ICD-10-CM

## 2013-06-27 LAB — TSH: TSH: 2.4 u[IU]/mL (ref 0.350–4.500)

## 2013-06-27 NOTE — Patient Instructions (Signed)
Nice to see you. We will check your thyroid today. Once this returns we will send in a refill on your synthroid. Please bring the lab report from your work back to the front desk.

## 2013-06-30 ENCOUNTER — Encounter: Payer: Self-pay | Admitting: Family Medicine

## 2013-06-30 DIAGNOSIS — Z0000 Encounter for general adult medical examination without abnormal findings: Secondary | ICD-10-CM | POA: Insufficient documentation

## 2013-06-30 MED ORDER — LEVOTHYROXINE SODIUM 112 MCG PO TABS: ORAL_TABLET | ORAL | Status: DC

## 2013-06-30 NOTE — Addendum Note (Signed)
Addended by: Birdie Sons, ERIC G on: 06/30/2013 12:04 PM   Modules accepted: Orders

## 2013-06-30 NOTE — Assessment & Plan Note (Signed)
Patient presented to discuss osteoporosis screening. She has a frax score of 0.4% likelihood of fracture over the next 10 years. She is a low risk for osteoporosis given her age and frax score. Will continue to monitor this. Patient with history of hyperplastic colon polyp, will need colonoscopy in 2016. Is due for mammogram.

## 2013-06-30 NOTE — Assessment & Plan Note (Signed)
Patient with TSH within the normal range. Will continue current dosing of synthroid.

## 2013-06-30 NOTE — Progress Notes (Signed)
Patient ID: Leah Cuevas, female   DOB: [birth year], 52 y.o.   MRN: [id number] Leah Cuevas is a 52 y.o. female who presents today for f/u on hypothyroidism and to discuss bone density scan.  HYPOTHYROIDISM Disease Monitoring Weight changes: none  Skin Changes: none  Heat/Cold intolerance: none   Fatigue: none   Constipation: none  Medication Monitoring Compliance:  taking   Last TSH:   Lab Results  Component Value Date   TSH 2.400 06/27/2013   Bone scan: patient states she wanted to discuss the need for a bone scan. She states is not sure if she needs this, but wanted to discuss the need for this. She does not smoke or drink alcohol. She denies history of fracture in her immediate family. She states she has an aunt who has osteoporosis.   Past Medical History  Diagnosis Date  . Thyroid disease   . Allergy   . Anemia   . Rotator cuff impingement syndrome   . Esophagitis   . Non-celiac gluten sensitivity   . Hyperplastic polyp of intestine     History  Smoking status  . Never Smoker   Smokeless tobacco  . Not on file    No family history on file.  Current Outpatient Prescriptions on File Prior to Visit  Medication Sig Dispense Refill  . levothyroxine (SYNTHROID, LEVOTHROID) 112 MCG tablet TAKE ONE TABLET BY MOUTH EVERY DAY  30 tablet  5  . Thyroid 113.75 MG TABS Take 120 mg by mouth daily.  90 tablet  1   No current facility-administered medications on file prior to visit.    ROS: Per HPI   Physical Exam Filed Vitals:   06/27/13 1605  BP: 121/55  Pulse: 63  Temp: 98.4 F (36.9 C)    Physical Examination: General appearance - alert, well appearing, and in no distress Neck - supple, no significant adenopathy, thyroid exam: thyroid is normal in size without nodules or tenderness Chest - clear to auscultation, no wheezes, rales or rhonchi, symmetric air entry Heart - normal rate, regular rhythm, normal S1, S2, no murmurs, rubs, clicks or  gallops   Lab Results  Component Value Date   TSH 2.400 06/27/2013    Assessment/Plan: Please see individual problem list.

## 2013-07-02 ENCOUNTER — Other Ambulatory Visit: Payer: Self-pay | Admitting: Family Medicine

## 2013-07-02 ENCOUNTER — Other Ambulatory Visit (HOSPITAL_COMMUNITY): Payer: Self-pay | Admitting: Internal Medicine

## 2013-07-02 ENCOUNTER — Other Ambulatory Visit (HOSPITAL_COMMUNITY): Payer: Self-pay | Admitting: *Deleted

## 2013-07-02 DIAGNOSIS — Z139 Encounter for screening, unspecified: Secondary | ICD-10-CM

## 2013-07-16 ENCOUNTER — Ambulatory Visit (HOSPITAL_COMMUNITY)
Admission: RE | Admit: 2013-07-16 | Discharge: 2013-07-16 | Disposition: A | Discharge status: Home / Self Care | Payer: PRIVATE HEALTH INSURANCE | Source: Ambulatory Visit | Attending: Family Medicine | Admitting: Family Medicine

## 2013-07-16 DIAGNOSIS — Z1231 Encounter for screening mammogram for malignant neoplasm of breast: Secondary | ICD-10-CM | POA: Insufficient documentation

## 2013-07-16 DIAGNOSIS — Z139 Encounter for screening, unspecified: Secondary | ICD-10-CM

## 2013-08-24 ENCOUNTER — Telehealth: Payer: Self-pay | Admitting: Family Medicine

## 2013-08-24 NOTE — Telephone Encounter (Signed)
Pt faxed over info at 7:45 from her insurance co about her wellness program. Dr Josephina Gip is to fill out the form and fax it back Please advise pt when this is done

## 2013-08-30 NOTE — Telephone Encounter (Signed)
LM informing pt of this. Jazmin Hartsell,CMA  

## 2013-08-30 NOTE — Telephone Encounter (Signed)
I have signed the patients form and it was placed in the fax box. Please inform the patient. Thanks.

## 2013-10-29 ENCOUNTER — Ambulatory Visit (INDEPENDENT_AMBULATORY_CARE_PROVIDER_SITE_OTHER): Payer: PRIVATE HEALTH INSURANCE | Admitting: Family Medicine

## 2013-10-29 ENCOUNTER — Encounter: Payer: Self-pay | Admitting: Family Medicine

## 2013-10-29 VITALS — BP 136/82 | HR 52 | Temp 98.5°F | Wt 128.0 lb

## 2013-10-29 DIAGNOSIS — H6120 Impacted cerumen, unspecified ear: Secondary | ICD-10-CM

## 2013-10-29 DIAGNOSIS — H9311 Tinnitus, right ear: Secondary | ICD-10-CM | POA: Insufficient documentation

## 2013-10-29 MED ORDER — CARBAMIDE PEROXIDE 6.5 % OT SOLN: 5.0000 [drp] | Freq: Two times a day (BID) | OTIC | Status: AC

## 2013-10-29 NOTE — Patient Instructions (Signed)
Leah Cuevas,  Thank you for coming in today. Your exam is normal except for cerumen (ear wax).  No signs of stroke.   For R ear ringing please do the following: Use debrox to soften wax.   Tilt head sideways and instill 5-10 drops twice daily up to 4 days, tip of applicator should not enter ear canal; keep drops in ear for several minutes by keeping head tilted and placing cotton in ear  Please schedule f/u with Dr. Caryl Bis in 1-2 weeks. If you develop worsening ringing or new symptoms include rash,  vision changes, weakness, facial numbness or facial droop please seek medical attention.  Dr. Adrian Blackwater

## 2013-10-29 NOTE — Assessment & Plan Note (Addendum)
For R ear ringing: suspect wax, medication no associated with ringing, no rash. Had facial tingling concerning for TIA. But she is low risk, tingling resolved and ringing persist.  P: Use debrox to soften wax.  Tilt head sideways and instill 5-10 drops twice daily up to 4 days, tip of applicator should not enter ear canal; keep drops in ear for several minutes by keeping head tilted and placing cotton in ear  Please schedule f/u with Dr. Caryl Bis in 1-2 weeks. If you develop worsening ringing or new symptoms include rash,  vision changes, weakness, facial numbness or facial droop please seek medical attention.

## 2013-10-29 NOTE — Progress Notes (Signed)
   Subjective:    Patient ID: Leah Cuevas, female    DOB: 1961-06-02, 53 y.o.   MRN: [id number]  HPI 53 yo F presents for SD visit:  1. R ear buzzing: x 6 days. Started acutely. While at rest. Associated with R facial tingling. No numbness, vision changes, facial droop, slurred speech. No preceding injury. No exposure to loud noises. No rash. She denies ear pain, fever, dizziness, headache, drainage from the ears. She tried a q tip and peroxide which worsened the buzzing.   Meds reviewed: synthroid and devils claw . No salicylates.   Soc Hx: non smoker  Review of Systems As per HPI     Objective:   Physical Exam BP 136/82  Pulse 52  Temp(Src) 98.5 F (36.9 C) (Oral)  Wt 128 lb (58.06 kg) General appearance: alert, cooperative and no distress Head: Normocephalic, without obvious abnormality, atraumatic Ears: abnormal external canal right ear - ceruminosis impacting canal and attempt to irrigate was no successful and abnormal external canal left ear - ceruminosis impacting canal attempt to irrigate was no successful CN intact.      Assessment & Plan:

## 2014-01-29 ENCOUNTER — Ambulatory Visit (INDEPENDENT_AMBULATORY_CARE_PROVIDER_SITE_OTHER): Payer: PRIVATE HEALTH INSURANCE | Admitting: Family Medicine

## 2014-01-29 ENCOUNTER — Encounter: Payer: Self-pay | Admitting: Family Medicine

## 2014-01-29 VITALS — BP 111/72 | HR 64 | Ht 65.0 in | Wt 128.0 lb

## 2014-01-29 DIAGNOSIS — H539 Unspecified visual disturbance: Secondary | ICD-10-CM

## 2014-01-29 DIAGNOSIS — E039 Hypothyroidism, unspecified: Secondary | ICD-10-CM

## 2014-01-29 NOTE — Patient Instructions (Addendum)
Nice to see you. We will call you with the results of your TSH. If your vision worsens in one particular eye, the lights you see get worse, you develop worsening pain in either eye, or redness in either eye you need to see your eye doctor as soon as possible. I will see you back in 6 months to check your thyroid again.

## 2014-01-30 ENCOUNTER — Encounter: Payer: Self-pay | Admitting: Family Medicine

## 2014-01-30 DIAGNOSIS — H539 Unspecified visual disturbance: Secondary | ICD-10-CM | POA: Insufficient documentation

## 2014-01-30 LAB — TSH: TSH: 3.671 u[IU]/mL (ref 0.350–4.500)

## 2014-01-30 MED ORDER — LEVOTHYROXINE SODIUM 112 MCG PO TABS: ORAL_TABLET | ORAL | Status: DC

## 2014-01-30 NOTE — Progress Notes (Signed)
Patient ID: Leah Cuevas, female   DOB: Dec 20, 1960, 53 y.o.   MRN: [id number]  Tommi Rumps, MD Phone: [phone]  Leah Cuevas is a 53 y.o. female who presents today for f/u.  HYPOTHYROIDISM Disease Monitoring Weight changes: no  Skin Changes: no Palpitations: infrequent heart racing Heat/Cold intolerance: heat bothers her  Medication Monitoring Compliance:  Taking synthroid 112 mcg daily   Last TSH:   Lab Results  Component Value Date   TSH 3.671 01/29/2014   Vision disturbance: patient reports over the past several weeks she has had an increase in visual experience of seeing headlights. She has had this previously, though not as frequently. She notes her eyes have felt tired as well. These things have improved with wearing of her glasses. She states there is a mild soreness of her eyes, though no pain. There has been no redness to her eyes. 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 She has an appointment with Dr Gershon Crane (optho) on 02/19/14.  Patient is a nonsmoker.   ROS: Per HPI   Physical Exam Filed Vitals:   01/29/14 1618  BP: 111/72  Pulse: 64    Gen: Well NAD HEENT: PERRL,  MMM, thyroid normal, left eye normal appearing fundus, right eye with normal appearing fundus though there is a cataract in this eye, no erythema of either eye, no hardness to either eye Lungs: CTABL Nl WOB Heart: RRR no MRG Exts: Non edematous BL  LE, warm and well perfused.   Vision 20/50 in both eye without glasses  Assessment/Plan: Please see individual problem list.  # Healthcare maintenance: not addressed

## 2014-01-30 NOTE — Assessment & Plan Note (Signed)
Minimal symptoms at this time. TSH in goal range. Will continue current dosing of synthroid. F/u in 6 months.

## 2014-01-30 NOTE — Assessment & Plan Note (Addendum)
Patient with sensation of seeing lights in vision with increasing frequency. With exception of cataract, exam is without abnormalities. Doubt this is related to an acute glaucoma given exam. No indication on history or exam of an emergent acute cause of this sensation. Patient has f/u scheduled with the opthalmologist already. She is to keep this appointment. Given return precautions.

## 2014-06-07 ENCOUNTER — Ambulatory Visit: Payer: PRIVATE HEALTH INSURANCE | Admitting: Family Medicine

## 2014-07-05 ENCOUNTER — Ambulatory Visit (INDEPENDENT_AMBULATORY_CARE_PROVIDER_SITE_OTHER): Payer: PRIVATE HEALTH INSURANCE | Admitting: Family Medicine

## 2014-07-05 VITALS — BP 107/66 | HR 70 | Temp 98.4°F | Ht 64.0 in | Wt 131.3 lb

## 2014-07-05 DIAGNOSIS — M79672 Pain in left foot: Secondary | ICD-10-CM

## 2014-07-05 NOTE — Patient Instructions (Signed)
Thank you so much for coming to visit today!  I have placed a referral to [facility]iat[facility] They will contact you concerning an appointment time!  Please let me know if there's anything else I can do for you!  Thanks again! Dr. Gerlean Ren

## 2014-07-08 DIAGNOSIS — M79672 Pain in left foot: Secondary | ICD-10-CM | POA: Insufficient documentation

## 2014-07-08 NOTE — Progress Notes (Signed)
Subjective:     Patient ID: Leah Cuevas, female   DOB: 27-Jun-1961, 53 y.o.   MRN: [id number]  HPI Leah Cuevas is a 53yo female presenting today for left foot pain.  # Left Foot Pain: - Notes that first digit of both feet is deviated and have been for years - Notes pain is worse in left foot. Pain worse at joint between distal phalanx of first digit and first metatarsal.  - Pain worse with walking - First digit of left foot has become deviated up underneath 2nd digit. Pain worse when straightening first digit. - She has tried orthotics and spacers in past without relief. Spacers seem to make pain worse - Has never had imaging of foot. Has never seen orthopedics concerning this problem  No other concerns at this visit. States she has history of depression, but she has not required medication for some time.  Review of Systems  Musculoskeletal: Positive for arthralgias.  Psychiatric/Behavioral: Negative for dysphoric mood and agitation.       Objective:   Physical Exam  Constitutional: She is oriented to person, place, and time. She appears well-developed and well-nourished. No distress.  Cardiovascular: Normal rate and regular rhythm.  Exam reveals no gallop and no friction rub.   No murmur heard. Pulmonary/Chest: Effort normal and breath sounds normal. No respiratory distress. She has no wheezes. She has no rales.  Abdominal: Soft. Bowel sounds are normal. She exhibits no distension. There is no tenderness.  Musculoskeletal: She exhibits no edema.  First digit of both feet deviated laterally L>R; pain with palpation at base of left distal phalanx; pain with straightening of first digit; no decreased sensation noted.  Neurological: She is alert and oriented to person, place, and time.  Psychiatric: She has a normal mood and affect. Her behavior is normal.      Assessment:     Please refer to Problem List for Assessment.     Plan:     Please refer to Problem List for  Plan.

## 2014-07-08 NOTE — Assessment & Plan Note (Signed)
-   Referral to podiatry. - Does not want anything for pain at this time and does not like to take medications if not absolutely necessary. Would like referral to podiatry for evaluation and possible treatment of deviation itself. - Follow up as needed or for worsening pain

## 2014-07-15 ENCOUNTER — Telehealth: Payer: Self-pay | Admitting: Family Medicine

## 2014-07-15 DIAGNOSIS — IMO0002 Reserved for concepts with insufficient information to code with codable children: Secondary | ICD-10-CM

## 2014-07-15 NOTE — Telephone Encounter (Signed)
Pt states that intercourse is uncomfortable and her husband states that "he can feel something"  She would like a referral to a female gynecologist. States that she has been to one in [place] before but she cant remember where it was. Rusell Meneely, Salome Spotted

## 2014-07-15 NOTE — Telephone Encounter (Signed)
Pt called and would like a referral to a gynecologist. Leah Cuevas

## 2014-07-15 NOTE — Telephone Encounter (Deleted)
Referral placed.

## 2014-07-16 NOTE — Telephone Encounter (Signed)
Order placed

## 2014-07-17 ENCOUNTER — Telehealth: Payer: Self-pay

## 2014-07-17 NOTE — Telephone Encounter (Signed)
PATIENT KNOWS OF APPT ON 12/17 AT 2PM W/DR. HARPER

## 2014-07-22 ENCOUNTER — Telehealth: Payer: Self-pay | Admitting: Family Medicine

## 2014-07-22 NOTE — Telephone Encounter (Signed)
Will forward to Dr. Gerlean Ren to see if this has been completed. Endora Teresi,CMA

## 2014-07-22 NOTE — Telephone Encounter (Signed)
Paperwork was placed in the "To Be Faxed" folder on day of office visit, so this paperwork should have been faxed already.

## 2014-07-22 NOTE — Telephone Encounter (Signed)
Pt called because Dr. Gerlean Ren is suppose to fax over her wellness visit paperwork. Pt is wondering if this was done. The name and fax number was on the forms that the patient left. jw

## 2014-07-26 ENCOUNTER — Ambulatory Visit: Payer: Self-pay | Admitting: Podiatry

## 2014-08-01 ENCOUNTER — Encounter: Payer: Self-pay | Admitting: Obstetrics

## 2014-08-01 ENCOUNTER — Ambulatory Visit (INDEPENDENT_AMBULATORY_CARE_PROVIDER_SITE_OTHER): Payer: PRIVATE HEALTH INSURANCE | Admitting: Obstetrics

## 2014-08-01 VITALS — BP 113/73 | HR 67 | Temp 98.2°F | Ht 64.0 in | Wt 131.0 lb

## 2014-08-01 DIAGNOSIS — IMO0002 Reserved for concepts with insufficient information to code with codable children: Secondary | ICD-10-CM

## 2014-08-01 DIAGNOSIS — R102 Pelvic and perineal pain: Secondary | ICD-10-CM

## 2014-08-01 DIAGNOSIS — N941 Dyspareunia: Secondary | ICD-10-CM

## 2014-08-01 NOTE — Addendum Note (Signed)
Addended by: Shelly Bombard on: 08/01/2014 09:38 PM   Modules accepted: Level of Service

## 2014-08-01 NOTE — Progress Notes (Signed)
Patient ID: Leah Cuevas, female   DOB: 11/14/1960, 53 y.o.   MRN: [id number]  Chief Complaint  Patient presents with  . Problem    NP, Pain     HPI Leah Cuevas is a 53 y.o. female.  Pelvic pain with intercourse.  Pain mainly in back of vagina, only with intercourse.  OB history significant for 1 child by C/S. HPI  Past Medical History  Diagnosis Date  . Thyroid disease   . Allergy   . Anemia   . Rotator cuff impingement syndrome   . Esophagitis   . Non-celiac gluten sensitivity   . Hyperplastic polyp of intestine     Past Surgical History  Procedure Laterality Date  . Appendectomy    . Colon surgery      Colonoscopy found polyps. Happened 2006    History reviewed. No pertinent family history.  Social History History  Substance Use Topics  . Smoking status: Never Smoker   . Smokeless tobacco: Not on file  . Alcohol Use: No    Allergies  Allergen Reactions  . Penicillins     REACTION: Vomiting. No rashes or trouble breathing.    Current Outpatient Prescriptions  Medication Sig Dispense Refill  . levothyroxine (SYNTHROID, LEVOTHROID) 112 MCG tablet TAKE ONE TABLET BY MOUTH EVERY DAY 30 tablet 5  . DEVILS CLAW PO Take 2 tablets by mouth 2 (two) times daily. For arthritis     No current facility-administered medications for this visit.    Review of Systems Review of Systems Constitutional: negative for fatigue and weight loss Respiratory: negative for cough and wheezing Cardiovascular: negative for chest pain, fatigue and palpitations Gastrointestinal: negative for abdominal pain and change in bowel habits Genitourinary: vaginal pain with intercourse Integument/breast: negative for nipple discharge Musculoskeletal:negative for myalgias Neurological: negative for gait problems and tremors Behavioral/Psych: negative for abusive relationship, depression Endocrine: negative for temperature intolerance     Blood pressure 113/73, pulse 67,  temperature 98.2 F (36.8 C), height 5\' 4"  (1.626 m), weight 131 lb (59.421 kg).  Physical Exam Physical Exam General:   alert  Skin:   no rash or abnormalities  Lungs:   clear to auscultation bilaterally  Heart:   regular rate and rhythm, S1, S2 normal, no murmur, click, rub or gallop  Breasts:   normal without suspicious masses, skin or nipple changes or axillary nodes  Abdomen:  normal findings: no organomegaly, soft, non-tender and no hernia  Pelvis:  External genitalia: normal general appearance Urinary system: urethral meatus normal and bladder without fullness, nontender Vaginal: positive tenderness with cervical touch and percussion Cervix: normal appearance but tender Adnexa: normal bimanual exam Uterus: retroverted and non-tender, normal size      Data Reviewed Labs  Assessment    Vaginal pain with intercourse.  Postmenopausal Hypoestrogenemia     Plan    Ultrasound ordered. F/U 2 weeks. Patient refused all meds for pain.  Osphena may be helpful.   Orders Placed This Encounter  Procedures  . SureSwab, Vaginosis/Vaginitis Plus  . US Transvaginal Non-OB    Standing Status: Future     Number of Occurrences:      Standing Expiration Date: 10/03/2015    Order Specific Question:  Reason for Exam (SYMPTOM  OR DIAGNOSIS REQUIRED)    Answer:  pelvic pain    Order Specific Question:  Preferred imaging location?    Answer:  [facility]  . US Pelvis Complete    Standing Status: Future     Number  of Occurrences:      Standing Expiration Date: 10/03/2015    Order Specific Question:  Reason for Exam (SYMPTOM  OR DIAGNOSIS REQUIRED)    Answer:  pelvic pain    Order Specific Question:  Preferred imaging location?    Answer:  [facility]   No orders of the defined types were placed in this encounter.        HARPER,CHARLES A 08/01/2014, 9:35 PM

## 2014-08-04 LAB — SURESWAB, VAGINOSIS/VAGINITIS PLUS
Atopobium vaginae: NOT DETECTED Log (cells/mL)
C. albicans, DNA: NOT DETECTED
C. glabrata, DNA: NOT DETECTED
C. parapsilosis, DNA: NOT DETECTED
C. trachomatis RNA, TMA: NOT DETECTED
C. tropicalis, DNA: NOT DETECTED
Gardnerella vaginalis: NOT DETECTED Log (cells/mL)
LACTOBACILLUS SPECIES: NOT DETECTED Log (cells/mL)
MEGASPHAERA SPECIES: NOT DETECTED Log (cells/mL)
N. gonorrhoeae RNA, TMA: NOT DETECTED
T. vaginalis RNA, QL TMA: NOT DETECTED

## 2014-08-06 ENCOUNTER — Ambulatory Visit (HOSPITAL_COMMUNITY): Payer: PRIVATE HEALTH INSURANCE

## 2014-08-15 ENCOUNTER — Ambulatory Visit: Payer: PRIVATE HEALTH INSURANCE | Admitting: Obstetrics

## 2014-08-23 ENCOUNTER — Ambulatory Visit (INDEPENDENT_AMBULATORY_CARE_PROVIDER_SITE_OTHER): Payer: PRIVATE HEALTH INSURANCE

## 2014-08-23 ENCOUNTER — Ambulatory Visit (INDEPENDENT_AMBULATORY_CARE_PROVIDER_SITE_OTHER): Payer: PRIVATE HEALTH INSURANCE | Admitting: Podiatry

## 2014-08-23 ENCOUNTER — Encounter: Payer: Self-pay | Admitting: Podiatry

## 2014-08-23 ENCOUNTER — Ambulatory Visit (HOSPITAL_COMMUNITY)
Admission: RE | Admit: 2014-08-23 | Discharge: 2014-08-23 | Disposition: A | Discharge status: Home / Self Care | Payer: PRIVATE HEALTH INSURANCE | Source: Ambulatory Visit | Attending: Obstetrics | Admitting: Obstetrics

## 2014-08-23 VITALS — BP 91/43 | HR 69 | Resp 11 | Ht 64.0 in | Wt 130.0 lb

## 2014-08-23 DIAGNOSIS — N941 Dyspareunia: Secondary | ICD-10-CM | POA: Diagnosis not present

## 2014-08-23 DIAGNOSIS — M2042 Other hammer toe(s) (acquired), left foot: Secondary | ICD-10-CM

## 2014-08-23 DIAGNOSIS — R102 Pelvic and perineal pain: Secondary | ICD-10-CM

## 2014-08-23 DIAGNOSIS — M21612 Bunion of left foot: Secondary | ICD-10-CM

## 2014-08-23 DIAGNOSIS — M2012 Hallux valgus (acquired), left foot: Secondary | ICD-10-CM

## 2014-08-23 NOTE — Progress Notes (Signed)
   Subjective:    Patient ID: Leah Cuevas, female    DOB: 08/10/1961, 55 y.o.   MRN: [id number]  HPI 54 year old female presents the office today with complaints of the left second digit deviating over the big toe. She states this is ongoing for several years. She states that she previously she did have more discomfort to the area but she has since changed her shoe gear and she's had significant improvement in symptoms. She denies any recent injury or trauma to bilateral lower extremity's. No other complaints at this time.   Review of Systems  Hematological: Bruises/bleeds easily.  All other systems reviewed and are negative.      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 The second digit is starting to override the hallux it is contracted. There is no overlying edema, erythema, increase in warmth at this time to bilateral lower extremities. There is no areas of pinpoint bony tenderness or pain with vibratory sensation. There is also a smaller HAV deformity present on the right side however not symptomatic. MMT 5/5, ROM WNL No open lesions or pre-ulcer lesions No pain with calf compression, swelling, warmth, erythema.     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 -X-rays were obtained and reviewed with the patient. -Treatment options were discussed both conservative and surgical including alternatives, risks, competitions. -At this time the patient elects to proceed with conservative treatment. We discussed shoe gear modifications, various padding and offloading devices as well. Also briefly discussed surgical intervention if she desired the future. -Follow-up as  needed. In the meantime occurs call the office with any questions, concerns, change in symptoms.

## 2014-09-05 ENCOUNTER — Ambulatory Visit (INDEPENDENT_AMBULATORY_CARE_PROVIDER_SITE_OTHER): Payer: PRIVATE HEALTH INSURANCE | Admitting: Obstetrics

## 2014-09-05 DIAGNOSIS — R102 Pelvic and perineal pain: Secondary | ICD-10-CM

## 2014-09-06 ENCOUNTER — Encounter: Payer: Self-pay | Admitting: Obstetrics

## 2014-09-06 NOTE — Progress Notes (Addendum)
   Patient ID: Leah Cuevas, female   DOB: May 30, 1961, 54 y.o.   MRN: [id number]  Chief Complaint  Patient presents with  . Follow-up    ultrasound    HPI Leah Cuevas is a 54 y.o. female.  Presents with history of pelvic pain.  Presents today for U/S results. HPI  Past Medical History  Diagnosis Date  . Thyroid disease   . Allergy   . Anemia   . Rotator cuff impingement syndrome   . Esophagitis   . Non-celiac gluten sensitivity   . Hyperplastic polyp of intestine     Past Surgical History  Procedure Laterality Date  . Appendectomy    . Colon surgery      Colonoscopy found polyps. Happened 2006    No family history on file.  Social History History  Substance Use Topics  . Smoking status: Never Smoker   . Smokeless tobacco: Not on file  . Alcohol Use: No    Allergies  Allergen Reactions  . Penicillins     REACTION: Vomiting. No rashes or trouble breathing.    Current Outpatient Prescriptions  Medication Sig Dispense Refill  . levothyroxine (SYNTHROID, LEVOTHROID) 112 MCG tablet TAKE ONE TABLET BY MOUTH EVERY DAY 30 tablet 5  . OVER THE COUNTER MEDICATION Menoquil - natural relief from menopausal symptoms Progesteril - natural relief from menopausal symptoms     No current facility-administered medications for this visit.    Review of Systems Review of Systems Constitutional: negative for fatigue and weight loss Respiratory: negative for cough and wheezing Cardiovascular: negative for chest pain, fatigue and palpitations Gastrointestinal: negative for abdominal pain and change in bowel habits Genitourinary:pelvic pain Integument/breast: negative for nipple discharge Musculoskeletal:negative for myalgias Neurological: negative for gait problems and tremors Behavioral/Psych: negative for abusive relationship, depression Endocrine: negative for temperature intolerance     There were no vitals taken for this visit.  Physical Exam Physical  Exam:  Deferred   Data Reviewed Ultrasound Labs   Assessment    Pelvic pain. Ultrasound WNL's     Plan  Will follow.    Orders Placed This Encounter  Procedures  . US Pelvis Complete    Standing Status: Future     Number of Occurrences:      Standing Expiration Date: 11/05/2015    Order Specific Question:  Reason for Exam (SYMPTOM  OR DIAGNOSIS REQUIRED)    Answer:  iud placement.  Pelvic pain    Order Specific Question:  Preferred imaging location?    Answer:  [facility]  . US Transvaginal Non-OB    Standing Status: Future     Number of Occurrences:      Standing Expiration Date: 11/05/2015    Order Specific Question:  Reason for Exam (SYMPTOM  OR DIAGNOSIS REQUIRED)    Answer:  iud placement.  Pelvic pain.    Order Specific Question:  Preferred imaging location?    Answer:  [facility]   No orders of the defined types were placed in this encounter.

## 2014-09-06 NOTE — Addendum Note (Signed)
Addended by: Baltazar Najjar A on: 09/06/2014 11:58 AM   Modules accepted: Level of Service

## 2014-10-07 ENCOUNTER — Other Ambulatory Visit: Payer: Self-pay | Admitting: Family Medicine

## 2014-10-07 NOTE — Telephone Encounter (Signed)
Letter mailed. Lakaya Tolen,CMA  

## 2014-10-07 NOTE — Telephone Encounter (Signed)
Refill given. Please inform patient that she needs a follow-up appointment for her hypothyroidism in June. Thanks.

## 2015-02-05 ENCOUNTER — Ambulatory Visit (INDEPENDENT_AMBULATORY_CARE_PROVIDER_SITE_OTHER): Payer: PRIVATE HEALTH INSURANCE | Admitting: Family Medicine

## 2015-02-05 ENCOUNTER — Encounter: Payer: Self-pay | Admitting: Family Medicine

## 2015-02-05 VITALS — BP 116/70 | HR 72 | Temp 98.4°F | Ht 64.0 in | Wt 133.0 lb

## 2015-02-05 DIAGNOSIS — R609 Edema, unspecified: Secondary | ICD-10-CM | POA: Diagnosis not present

## 2015-02-05 DIAGNOSIS — N941 Dyspareunia: Secondary | ICD-10-CM | POA: Diagnosis not present

## 2015-02-05 DIAGNOSIS — E039 Hypothyroidism, unspecified: Secondary | ICD-10-CM

## 2015-02-05 DIAGNOSIS — IMO0002 Reserved for concepts with insufficient information to code with codable children: Secondary | ICD-10-CM | POA: Insufficient documentation

## 2015-02-05 DIAGNOSIS — M25473 Effusion, unspecified ankle: Secondary | ICD-10-CM

## 2015-02-05 LAB — CBC
HCT: 36.2 % (ref 36.0–46.0)
Hemoglobin: 11.4 g/dL — ABNORMAL LOW (ref 12.0–15.0)
MCH: 23.1 pg — ABNORMAL LOW (ref 26.0–34.0)
MCHC: 31.5 g/dL (ref 30.0–36.0)
MCV: 73.3 fL — ABNORMAL LOW (ref 78.0–100.0)
MPV: 10.4 fL (ref 8.6–12.4)
Platelets: 217 10*3/uL (ref 150–400)
RBC: 4.94 MIL/uL (ref 3.87–5.11)
RDW: 17.8 % — ABNORMAL HIGH (ref 11.5–15.5)
WBC: 5.8 10*3/uL (ref 4.0–10.5)

## 2015-02-05 LAB — COMPREHENSIVE METABOLIC PANEL
ALT: 8 U/L (ref 0–35)
AST: 17 U/L (ref 0–37)
Albumin: 4.1 g/dL (ref 3.5–5.2)
Alkaline Phosphatase: 67 U/L (ref 39–117)
BUN: 16 mg/dL (ref 6–23)
CO2: 29 mEq/L (ref 19–32)
Calcium: 8.9 mg/dL (ref 8.4–10.5)
Chloride: 103 mEq/L (ref 96–112)
Creat: 0.88 mg/dL (ref 0.50–1.10)
Glucose, Bld: 86 mg/dL (ref 70–99)
Potassium: 3.9 mEq/L (ref 3.5–5.3)
Sodium: 140 mEq/L (ref 135–145)
Total Bilirubin: 0.6 mg/dL (ref 0.2–1.2)
Total Protein: 7 g/dL (ref 6.0–8.3)

## 2015-02-05 LAB — TSH: TSH: 9.965 u[IU]/mL — ABNORMAL HIGH (ref 0.350–4.500)

## 2015-02-05 NOTE — Progress Notes (Signed)
Patient ID: Leah Cuevas, female   DOB: 1961/03/25, 54 y.o.   MRN: [id number]  Tommi Rumps, MD Phone: [phone]  Leah Cuevas is a 54 y.o. female who presents today for f/u.  HYPOTHYROIDISM Disease Monitoring Weight changes: up 3 lbs  Skin Changes: dry skin, though stable Palpitations: no Heat/Cold intolerance: some heat intolerance  Medication Monitoring Compliance:  Taking most days.   Last TSH:   Lab Results  Component Value Date   TSH 3.671 01/29/2014   Saw Dr Jodi Mourning for pain with intercourse. Significant other can't insert all the way because pain occurs with full insertion like he is hitting something. Postmenopausal for 6 years. Did not get to discuss this with Dr Jodi Mourning as he was called out for a delivery. Maybe they talked about hormone replacement therapy. Does have some dryness. No abdominal or pelvic pain. No bleeding. US pelvis was normal.  LE edema: patient notes recent intermittent ankle edema. None at this time. Denies orthopnea, PND, renal issues, and liver issues in the past. Notes the swelling does improve with propping feet up and with laying down. States has a history of anemia and hypothyroidism.   PMH: nonsmoker.    ROS: Per HPI   Physical Exam Filed Vitals:   02/05/15 0950  BP: 116/70  Pulse:   Temp:     Gen: Well NAD HEENT: PERRL,  MMM, normal thyroid Lungs: CTABL Nl WOB Heart: RRR, no murmur appreciated Abd: soft, NT, ND Exts: Non edematous BL  LE, warm and well perfused. No calf tenderness, no cords palpated   Assessment/Plan: Please see individual problem list.  Tommi Rumps, MD [place] PGY-3

## 2015-02-05 NOTE — Patient Instructions (Signed)
Nice to see you. Please call Dr Jacelyn Grip office to set up follow-up for a pap smear. We will check lab work today for your swelling.  Please call to set up a colonoscopy at Dr Ricky Stabs office [phone].

## 2015-02-05 NOTE — Assessment & Plan Note (Addendum)
Patient notes history of this and has seen Dr Jodi Mourning for this recently, though notes she has not followed up with him yet to further discuss this. Had normal pelvic US earlier this year. Had infectious testing done at that time that was negative. Had discussion with patient regarding doing a pelvic exam today with pap smear and attempting treatment if indicated and having patient follow-up with Dr Jacelyn Grip office vs having the patient follow up with Dr Jacelyn Grip office for pap and further management of this issue. I advised either way I would have her follow-up with Dr Jacelyn Grip office for this issue and thus the patient declined the pelvic exam and pap smear today, choosing to have this done through Dr Jacelyn Grip office. She has scheduled an appointment 02/19/15 at Dr Jacelyn Grip office.

## 2015-02-05 NOTE — Assessment & Plan Note (Signed)
Notes some heat intolerance. Will check TSH today. Medication changes will depend on lab results.

## 2015-02-06 DIAGNOSIS — M25473 Effusion, unspecified ankle: Secondary | ICD-10-CM | POA: Insufficient documentation

## 2015-02-06 NOTE — Assessment & Plan Note (Signed)
No edema today. No symptoms of CHF. Unlikely DVT given bilateral swelling and lack of calf tenderness. Suspect venous insufficiency vs related to thyroid. Will check TSH, cbc, and CMET. Given return precautions.

## 2015-02-07 ENCOUNTER — Telehealth: Payer: Self-pay | Admitting: Family Medicine

## 2015-02-07 DIAGNOSIS — D649 Anemia, unspecified: Secondary | ICD-10-CM

## 2015-02-07 NOTE — Telephone Encounter (Signed)
Attempted call patient to discuss lab work. There was no answer and I left a message advising patient to call the office. Her TSH was above the upper limit of normal and patient would benefit from increased dose of synthroid. Her hemoglobin (blood count) was mildly low and indicative of iron deficiency. I would like for her to come in for additional blood work to evaluate this further and also want to determine if she is bleeding from anywhere. Will await her call back.

## 2015-02-10 NOTE — Telephone Encounter (Signed)
Spoke with patient and she is aware of results.  She is agreeable on increasing medication and getting additional labs.  She plans to go to [place] to have her labs drawn.  Tishawna Larouche,CMA

## 2015-02-11 MED ORDER — LEVOTHYROXINE SODIUM 125 MCG PO TABS: 125.0000 ug | ORAL_TABLET | Freq: Every day | ORAL | Status: DC

## 2015-02-11 NOTE — Telephone Encounter (Signed)
Sent synthroid to the pharmacy. Placed orders for labs. Patient needs to have stool cards done as well and follow-up for her colonoscopy to evaluate for rectal bleeding. Please inform the patient.

## 2015-02-19 ENCOUNTER — Encounter: Payer: Self-pay | Admitting: Obstetrics

## 2015-02-19 ENCOUNTER — Ambulatory Visit (INDEPENDENT_AMBULATORY_CARE_PROVIDER_SITE_OTHER): Payer: PRIVATE HEALTH INSURANCE | Admitting: Obstetrics

## 2015-02-19 ENCOUNTER — Other Ambulatory Visit: Payer: PRIVATE HEALTH INSURANCE

## 2015-02-19 VITALS — BP 109/76 | HR 66 | Temp 98.2°F | Ht 64.0 in | Wt 134.0 lb

## 2015-02-19 DIAGNOSIS — D649 Anemia, unspecified: Secondary | ICD-10-CM

## 2015-02-19 DIAGNOSIS — IMO0002 Reserved for concepts with insufficient information to code with codable children: Secondary | ICD-10-CM

## 2015-02-19 DIAGNOSIS — Z124 Encounter for screening for malignant neoplasm of cervix: Secondary | ICD-10-CM | POA: Diagnosis not present

## 2015-02-19 DIAGNOSIS — R102 Pelvic and perineal pain unspecified side: Secondary | ICD-10-CM

## 2015-02-19 DIAGNOSIS — Z0000 Encounter for general adult medical examination without abnormal findings: Secondary | ICD-10-CM

## 2015-02-19 DIAGNOSIS — Z01419 Encounter for gynecological examination (general) (routine) without abnormal findings: Secondary | ICD-10-CM

## 2015-02-19 DIAGNOSIS — N941 Dyspareunia: Secondary | ICD-10-CM

## 2015-02-19 NOTE — Progress Notes (Signed)
Ferritin and tibc done today Leah Cuevas

## 2015-02-19 NOTE — Progress Notes (Addendum)
Subjective:        Leah Cuevas is a 54 y.o. female here for a routine exam.  Current complaints: Severe vaginal pain with intercourse.  Feels well lubricated.  Personal health questionnaire:  Is patient Ashkenazi Jewish, have a family history of breast and/or ovarian cancer: no Is there a family history of uterine cancer diagnosed at age < 58, gastrointestinal cancer, urinary tract cancer, family member who is a Field seismologist syndrome-associated carrier: no Is the patient overweight and hypertensive, family history of diabetes, personal history of gestational diabetes, preeclampsia or PCOS: no Is patient over 27, have PCOS,  family history of premature CHD under age 28, diabetes, smoke, have hypertension or peripheral artery disease:  no At any time, has a partner hit, kicked or otherwise hurt or frightened you?: no Over the past 2 weeks, have you felt down, depressed or hopeless?: no Over the past 2 weeks, have you felt little interest or pleasure in doing things?:no   Gynecologic History No LMP recorded. Patient is postmenopausal. Contraception: post menopausal status Last Pap: 2013. Results were: normal Last mammogram: 2014. Results were: normal  Obstetric History OB History  No data available    Past Medical History  Diagnosis Date  . Thyroid disease   . Allergy   . Anemia   . Rotator cuff impingement syndrome   . Esophagitis   . Non-celiac gluten sensitivity   . Hyperplastic polyp of intestine     Past Surgical History  Procedure Laterality Date  . Appendectomy    . Colon surgery      Colonoscopy found polyps. Happened 2006     Current outpatient prescriptions:  .  levothyroxine (SYNTHROID, LEVOTHROID) 125 MCG tablet, Take 1 tablet (125 mcg total) by mouth daily., Disp: 30 tablet, Rfl: 3 Allergies  Allergen Reactions  . Penicillins     REACTION: Vomiting. No rashes or trouble breathing.    History  Substance Use Topics  . Smoking status: Never Smoker   .  Smokeless tobacco: Not on file  . Alcohol Use: No    History reviewed. No pertinent family history.    Review of Systems  Constitutional: negative for fatigue and weight loss Respiratory: negative for cough and wheezing Cardiovascular: negative for chest pain, fatigue and palpitations Gastrointestinal: negative for abdominal pain and change in bowel habits Musculoskeletal:negative for myalgias Neurological: negative for gait problems and tremors Behavioral/Psych: negative for abusive relationship, depression Endocrine: negative for temperature intolerance   Genitourinary:negative for abnormal menstrual periods, genital lesions, hot flashes, sexual problems and vaginal discharge.  Positive for dyspareunia Integument/breast: negative for breast lump, breast tenderness, nipple discharge and skin lesion(s)    Objective:       BP 109/76 mmHg  Pulse 66  Temp(Src) 98.2 F (36.8 C)  Ht 5\' 4"  (1.626 m)  Wt 134 lb (60.782 kg)  BMI 22.99 kg/m2 General:   alert  Skin:   no rash or abnormalities  Lungs:   clear to auscultation bilaterally  Heart:   regular rate and rhythm, S1, S2 normal, no murmur, click, rub or gallop  Breasts:   normal without suspicious masses, skin or nipple changes or axillary nodes  Abdomen:  normal findings: no organomegaly, soft, non-tender and no hernia  Pelvis:  External genitalia: normal general appearance Urinary system: urethral meatus normal and bladder without fullness, nontender Vaginal: moderate tenderness with introduction of speculum and increased tenderness with manipulation of speculum  Cervix: normal appearance Adnexa: normal bimanual exam Uterus: anteverted and non-tender, normal  size   Lab Review Urine pregnancy test Labs reviewed yes Radiologic studies reviewed yes    Assessment:    Healthy female exam.    Dyspareunia   Plan:   Referred to [place] Pink MD in consultation for this severe dyspareunia.   Education reviewed:  calcium supplements, depression evaluation, self breast exams and weight bearing exercise. Follow up in: 1 year.   No orders of the defined types were placed in this encounter.   Orders Placed This Encounter  Procedures  . Ambulatory referral to Gynecology    Referral Priority:  Routine    Referral Type:  Consultation    Referral Reason:  Specialty Services Required    Requested Specialty:  Gynecology    Number of Visits Requested:  1

## 2015-02-19 NOTE — Addendum Note (Signed)
Addended by: Carole Binning on: 02/19/2015 04:35 PM   Modules accepted: Orders

## 2015-02-20 ENCOUNTER — Other Ambulatory Visit: Payer: Self-pay | Admitting: Obstetrics

## 2015-02-20 DIAGNOSIS — Z1231 Encounter for screening mammogram for malignant neoplasm of breast: Secondary | ICD-10-CM

## 2015-02-20 LAB — PAP IG AND HPV HIGH-RISK: HPV DNA High Risk: NOT DETECTED

## 2015-02-20 LAB — IRON AND TIBC
%SAT: 14 % — ABNORMAL LOW (ref 20–55)
Iron: 51 ug/dL (ref 42–145)
TIBC: 354 ug/dL (ref 250–470)
UIBC: 303 ug/dL (ref 125–400)

## 2015-02-20 LAB — FERRITIN: Ferritin: 5 ng/mL — ABNORMAL LOW (ref 10–291)

## 2015-02-22 LAB — SURESWAB BACTERIAL VAGINOSIS/ITIS
Atopobium vaginae: NOT DETECTED Log (cells/mL)
C. albicans, DNA: NOT DETECTED
C. glabrata, DNA: NOT DETECTED
C. parapsilosis, DNA: NOT DETECTED
C. tropicalis, DNA: NOT DETECTED
Gardnerella vaginalis: NOT DETECTED Log (cells/mL)
LACTOBACILLUS SPECIES: NOT DETECTED Log (cells/mL)
MEGASPHAERA SPECIES: NOT DETECTED Log (cells/mL)
T. vaginalis RNA, QL TMA: NOT DETECTED

## 2015-02-25 ENCOUNTER — Encounter: Payer: Self-pay | Admitting: Family Medicine

## 2015-02-26 ENCOUNTER — Ambulatory Visit (HOSPITAL_COMMUNITY)
Admission: RE | Admit: 2015-02-26 | Discharge: 2015-02-26 | Disposition: A | Discharge status: Home / Self Care | Payer: PRIVATE HEALTH INSURANCE | Source: Ambulatory Visit | Attending: Obstetrics | Admitting: Obstetrics

## 2015-02-26 DIAGNOSIS — Z1231 Encounter for screening mammogram for malignant neoplasm of breast: Secondary | ICD-10-CM | POA: Insufficient documentation

## 2015-05-21 ENCOUNTER — Ambulatory Visit (INDEPENDENT_AMBULATORY_CARE_PROVIDER_SITE_OTHER): Payer: BLUE CROSS/BLUE SHIELD | Admitting: Internal Medicine

## 2015-05-21 ENCOUNTER — Encounter: Payer: Self-pay | Admitting: Internal Medicine

## 2015-05-21 VITALS — BP 108/61 | HR 72 | Temp 98.0°F | Ht 64.0 in | Wt 140.0 lb

## 2015-05-21 DIAGNOSIS — R21 Rash and other nonspecific skin eruption: Secondary | ICD-10-CM | POA: Diagnosis not present

## 2015-05-21 DIAGNOSIS — E039 Hypothyroidism, unspecified: Secondary | ICD-10-CM

## 2015-05-21 DIAGNOSIS — D126 Benign neoplasm of colon, unspecified: Secondary | ICD-10-CM

## 2015-05-21 DIAGNOSIS — Z862 Personal history of diseases of the blood and blood-forming organs and certain disorders involving the immune mechanism: Secondary | ICD-10-CM

## 2015-05-21 DIAGNOSIS — K635 Polyp of colon: Secondary | ICD-10-CM

## 2015-05-21 DIAGNOSIS — L570 Actinic keratosis: Secondary | ICD-10-CM | POA: Insufficient documentation

## 2015-05-21 DIAGNOSIS — Z1211 Encounter for screening for malignant neoplasm of colon: Secondary | ICD-10-CM | POA: Diagnosis not present

## 2015-05-21 NOTE — Assessment & Plan Note (Addendum)
Patient denies changes in stool, no blood in stool or tarry stool - Referred to GI for colonoscopy

## 2015-05-21 NOTE — Assessment & Plan Note (Signed)
Iron studies done in July 2016 consistent with iron deficiency anemia. Patient is vegetarian. Denies hematuria, blood in stool/tarry stools. - Patient declines iron supplements today. - Discussed the importance of eating dark leafy greens and other foods high in iron - patient would also like to research to determine if there are any natural iron supplements - due for colonoscopy; made referral

## 2015-05-21 NOTE — Assessment & Plan Note (Signed)
Somewhat circular rash on chest (right sternal border) that is pink, mildly raised, rough to touch, and with some scaling. Rash present for over 2 years and patient noted new change in August (mildly raised and scaly). Ddx include actinic keratosis vs cutaneous squamous cell carcinoma.  - punch biopsy done today and sample sent for pathology

## 2015-05-21 NOTE — Assessment & Plan Note (Signed)
left cheek at the level of the antihelix of the ear there are two flat macules (0.5-0.6cm in diameter) that are slightly hyperpigmented but rough to touch; no scaling noted. Has been present for years per patient and has been stable - continue to monitor

## 2015-05-21 NOTE — Assessment & Plan Note (Signed)
Has not been compliant with medication during the weekend. Last dose increased in June due to TSH at 9.965. Encouraged patient to take daily.  - will check TSH in 4-6 weeks

## 2015-05-21 NOTE — Patient Instructions (Signed)
You were seen for your rash today. We did a biopsy of your skin. I will call you with the results. If you do not hear from our office in over 1 week, please give [place] a call. If you have any questions about your biopsy site, please give [place] a call.  We talked about your iron labs today. You decided to not take iron supplements that we usually prescribe. I encourage you to eat dark leafy greens in your diet for your anemia. You were interested in researching to see if there are any natural iron supplements; please let us know what you find.  We also made a referral for GI for your colonoscopy. We will call you with this appointment. Please call us about this if you have not heard back in 2-3 weeks.  Please see [place] back in 4-6 weeks so we can check your thyroid. Please try to take synthroid everyday!  Nice to meet you and have a great day!

## 2015-05-21 NOTE — Progress Notes (Signed)
Patient ID: Leah Cuevas, female   DOB: 1960-09-29, 54 y.o.   MRN: [id number] Subjective:   CC: skin rash on chest, follow up regarding blood work done in July  HPI:  Skin Rash: Patient notes of small rash on her chest at right upper sternal border. Has been present for the past two years. This spot has always bee light pink and rough but in August after she came back from the beach she noticed the spot was raised, rough, and now scaly. No change in size. No Pain and rarely itches. Patient also notes of "spots" on the right side of face which are slightly rough to touch; have not changed in size or quality. These have been present since she started menopause years ago. Patient usually uses sunscreen, but did not use regularly as a young adult. No personal history or family history of skin cancer.   Lab Work: for Anemia Discussed lab work done in July to evaluate for anemia. Hgb 11.4 (MCV microcytic), Ferritin 5, Fe 51, TIBC 354, %saturation 14 indicative of iron deficiency anemia. Patient denies hematuria, changes in stool, blood in stool, or dark/tarry stools. Patient has a history of hyperplastic poly of intestine found by colonoscopy ii 2009. Patient is due for repeat colonoscopy in 2016. Patient is agreeable for referral to GI for this.   Hypothyroidism: Patient was aware that Dr. Caryl Bis increased her synthroid dose in July as her TSH was elevated. States she takes it regularly during the week but forgets most weekends. Notes that she may have gained a few pounds (per chart review weight today 140 from 143 in July 2016). Also continues to note of cold intolerance which has not improved since increasing dose. No hair/skin changes. Rarely has constipation.   Review of Systems - Per HPI.  PMH, FH, or SH Smoking status: never smoker    Objective:  Physical Exam There were no vitals taken for this visit. GEN: NAD CV: RRR, no murmurs, rubs, or gallops PULM: CTAB, normal effort SKIN:  Chest: somewhat circular pink plaque with somewhat irregular border, is slightly raised, and is rough to touch; it is mildly blanching. Face: left cheek at the level of the antihelix of the ear there are two flat macules (0.5-0.6cm in diameter) that are slightly hyperpigmented but rough to touch; no scaling noted.   PSYCH: Mood and affect euthymic, normal rate and volume of speech NEURO: Awake, alert, no focal deficits grossly, normal speech      Assessment:     Leah Cuevas is a 54 y.o. female with h/o hypothyroidism, hx of anemia, and hx of hyperplastic polyp of intestine (2009) presenting with skin rash (on chest at right upper sternal border and left cheek) and to discuss iron labs.     Plan:     # See problem list and after visit summary for problem-specific plans.Cir  # Health Maintenance: referral to GI for colonoscopy; declined Influenza vaccine   Follow-up: Follow up in 4-6 weeks for hypothyroidism    Smiley Houseman, MD [place]

## 2015-05-22 ENCOUNTER — Telehealth: Payer: Self-pay | Admitting: Internal Medicine

## 2015-05-22 NOTE — Telephone Encounter (Signed)
Pt called because she had a place on her chest and the doctor removed this. Her question is should she be sore in the place they removed. Please call and let patient know. jw

## 2015-05-22 NOTE — Telephone Encounter (Signed)
Patient states that she is having soreness in her shoulders and neck area.  States that she returned back to work after the procedure yesterday and uses her arms quite a bit.  Not sure if this extra soreness is to be expected.  She plans to take the bandage off today to check on the actual site and make sure that it looks like it is healing normally. Maryna Yeagle,CMA

## 2015-05-22 NOTE — Telephone Encounter (Signed)
Yes she can be sore in this area for a few days. Ask her to monitor for any signs of infection such as increasing redness, purulent drainage, increasing pain, increasing warmth of the area, fevers/chills. She can also remove her bandage when site has stopped bleeding which it should have by now or soon. Thank you

## 2015-05-22 NOTE — Telephone Encounter (Signed)
Will forward to PCP for advice. Kailin Leu, CMA. 

## 2015-05-26 ENCOUNTER — Telehealth: Payer: Self-pay | Admitting: Internal Medicine

## 2015-05-26 NOTE — Telephone Encounter (Signed)
Pt states that she received a phone call this morning but was unable to take the call. States that she is returning the call. Additionally, she would like to know the results of her biopsy. Leah Cuevas, ASA

## 2015-05-26 NOTE — Telephone Encounter (Signed)
Called patient this morning and got voicemail; I did not leave a message. Called patient this afternoon after she called the clinic and updated Leah Cuevas regarding her skin biopsy results.

## 2015-05-26 NOTE — Telephone Encounter (Signed)
Will forward to MD to see if she called patient. Jazmin Hartsell,CMA  

## 2015-06-26 ENCOUNTER — Encounter: Payer: Self-pay | Admitting: Internal Medicine

## 2015-06-26 ENCOUNTER — Ambulatory Visit (INDEPENDENT_AMBULATORY_CARE_PROVIDER_SITE_OTHER): Payer: BLUE CROSS/BLUE SHIELD | Admitting: Internal Medicine

## 2015-06-26 VITALS — BP 128/79 | HR 65 | Temp 98.3°F | Ht 64.0 in | Wt 140.9 lb

## 2015-06-26 DIAGNOSIS — D509 Iron deficiency anemia, unspecified: Secondary | ICD-10-CM

## 2015-06-26 DIAGNOSIS — H11153 Pinguecula, bilateral: Secondary | ICD-10-CM

## 2015-06-26 DIAGNOSIS — Z13220 Encounter for screening for lipoid disorders: Secondary | ICD-10-CM

## 2015-06-26 DIAGNOSIS — E038 Other specified hypothyroidism: Secondary | ICD-10-CM

## 2015-06-26 DIAGNOSIS — H04123 Dry eye syndrome of bilateral lacrimal glands: Secondary | ICD-10-CM | POA: Insufficient documentation

## 2015-06-26 LAB — TSH: TSH: 3.162 u[IU]/mL (ref 0.350–4.500)

## 2015-06-26 MED ORDER — FERROUS SULFATE 325 (65 FE) MG PO TABS: 325.0000 mg | ORAL_TABLET | Freq: Every day | ORAL | Status: DC

## 2015-06-26 NOTE — Patient Instructions (Signed)
I sent a prescription in to the pharmacy for iron supplements. Take one tablet daily (not with your synthroid) - come back in 4-6 weeks to recheck iron studies - we will check you thyroid level; i will call you if it is abnormal.  - i also ordered a cholesterol panel for the future; you can make a lab visit to get this done when you are fasting.  - eye drops you can try: Bion, Soothe, Tears naturale, Viva drops do not seem to have alcohol in them

## 2015-06-26 NOTE — Assessment & Plan Note (Signed)
-   fasting lipid panel ordered as future

## 2015-06-26 NOTE — Assessment & Plan Note (Signed)
3 week history. No concerning signs or symptoms; exam was essentially unremarkable other than findings of pinguecula bilaterally. Visual acuity measured in clinic - recommended OTC artificial tears - return to clinic if symptoms do no improve or worsen  - has opthalmology appointment in January

## 2015-06-26 NOTE — Assessment & Plan Note (Signed)
Has been compliant since last seen in clinic. No symptoms of hyper or hypothyroidism. Will check TSH before making changes to dose.  - TSH today

## 2015-06-26 NOTE — Assessment & Plan Note (Signed)
Iron studies from June significant for iron deficiency anemia. (Fe 51, %sat 14, Ferritin 5; hgb 11.4) At previous visit patient declined starting supplements, but today she is agreeable.  - Ferrous Sulfate 325mg  daily  - follow up in 4-6 weeks to recheck iron studies

## 2015-06-26 NOTE — Progress Notes (Signed)
Patient ID: Leah Cuevas, female   DOB: April 07, 1961, 54 y.o.   MRN: [id number] Date of Visit: 06/26/2015   HPI: CC: f/u thyroid; burning eyes  Hypothyroidism: - compliant with medications; only missed one weekend in the past month - denies heat or cold intolerance, constipation or diarrhea, skin changes, hair changes, palpitations, fatigue  Burning Eyes: - notes of dry and burning eyes for the past 3 weeks; has slightly worsened this past week; started along with a cold which is slowly improving  - Denies blurred vision but states she has to squint at times. States that it is time for her to see her eye doctor (appointment in January); no spots, floaters, flashes of light in visual field, no tearing  - denies discharge, swelling - no seasonal allergies - worsens with reading or looking at computer monitor  - works as a Quarry manager and works with fabrics; denies any new exposures recently   Iron Deficiency Anemia:  - discussed June lab results at previous visit; patient declined supplements and wanted to do research to see if there were any natural options  - today she is agreeable to start Fe supplements   ROS: See HPI.  PMH:  Hypothyroidism   PHYSICAL EXAM: BP 128/79 mmHg  Pulse 65  Temp(Src) 98.3 F (36.8 C) (Oral)  Ht 5\' 4"  (1.626 m)  Wt 140 lb 14.4 oz (63.912 kg)  BMI 24.17 kg/m2  SpO2 99% Gen: NAD HEENT: no conjunctival injection, pinguecula noted on sclerae bilaterally, no discharge, EMOI, PERRL Neck: thyroid palpation normal (no nodules palpated) Visual acuity tested in clinic: both 20/25; L 20/60; R 20/40 Heart: RRR, no m/r/g Lungs: CTAB, no signs of increased work of breathing Neuro: neuro grossly normal Ext: no LE edema noted  ASSESSMENT/PLAN:  Health maintenance:  - referred to GI for colonoscopy at previous visit - declined Hep C and HIV screening   FOLLOW UP: F/u in 4-6 weeks for repeat iron studies after initiating Fe supplements  Dennie Fetters,  MD [place]

## 2015-06-28 LAB — LIPID PANEL
Cholesterol: 156 mg/dL (ref 125–200)
HDL: 50 mg/dL (ref >=46)
LDL Cholesterol: 84 mg/dL (ref <130)
Total CHOL/HDL Ratio: 3.1 Ratio (ref <=5.0)
Triglycerides: 110 mg/dL (ref <150)
VLDL: 22 mg/dL (ref <30)

## 2015-07-01 ENCOUNTER — Other Ambulatory Visit: Payer: Self-pay | Admitting: Internal Medicine

## 2015-07-01 NOTE — Telephone Encounter (Signed)
Pt called and needs a refill on her Synthroid and also a new prescription for Emergen C to take with her iron pills.  jw

## 2015-07-02 ENCOUNTER — Other Ambulatory Visit: Payer: Self-pay | Admitting: Family Medicine

## 2015-07-02 NOTE — Telephone Encounter (Signed)
Dr. Sonnenberg is in a new office, please advise refill.  Thanks, Tanya, RN 

## 2015-07-02 NOTE — Telephone Encounter (Signed)
Pt wonders which dose she will take since according to what she read on MyChart her numbers are good She doesn't have any to take for tonight

## 2015-07-15 ENCOUNTER — Telehealth: Payer: Self-pay

## 2015-07-15 NOTE — Telephone Encounter (Signed)
Pt left Vm that she received a letter to schedule a colonoscopy. She was referred by her PCP.

## 2015-07-15 NOTE — Telephone Encounter (Signed)
I called pt and she said her last colonoscopy was done in 2012 and it was recommended that her next be in 2016 due to the type polyps that she had that could have turned into cancer,  She said she thinks it was done at [facility] and she will get the results for [place] and call back for OV appt.

## 2015-07-18 NOTE — Telephone Encounter (Signed)
Letter mailed to pt to call.  

## 2015-07-21 ENCOUNTER — Encounter: Payer: Self-pay | Admitting: Internal Medicine

## 2015-07-30 ENCOUNTER — Telehealth: Payer: Self-pay

## 2015-07-30 NOTE — Telephone Encounter (Signed)
Gastroenterology Pre-Procedure Review  Request Date: 07/30/2015 Requesting Physician: Smiley Houseman  MD  PATIENT REVIEW QUESTIONS: The patient responded to the following health history questions as indicated:    1. Diabetes Melitis: no 2. Joint replacements in the past 12 months: no 3. Major health problems in the past 3 months: no 4. Has an artificial valve or MVP: no 5. Has a defibrillator: no 6. Has been advised in past to take antibiotics in advance of a procedure like teeth cleaning: no 7. Family history of colon cancer: no  8. Alcohol Use: no 9. History of sleep apnea: no     MEDICATIONS & ALLERGIES:    Patient reports the following regarding taking any blood thinners:   Plavix? no Aspirin? no Coumadin? no  Patient confirms/reports the following medications:  Current Outpatient Prescriptions  Medication Sig Dispense Refill  . Ascorbic Acid (VITAMIN C) 1000 MG tablet Take 1,000 mg by mouth daily.    . ferrous sulfate 325 (65 FE) MG tablet Take 1 tablet (325 mg total) by mouth daily. 30 tablet 3  . Flaxseed, Linseed, (FLAX SEED OIL PO) Take by mouth.    . levothyroxine (SYNTHROID, LEVOTHROID) 125 MCG tablet TAKE ONE TABLET BY MOUTH ONCE DAILY 30 tablet 0   No current facility-administered medications for this visit.    Patient confirms/reports the following allergies:  Allergies  Allergen Reactions  . Penicillins     REACTION: Vomiting. No rashes or trouble breathing.    No orders of the defined types were placed in this encounter.    AUTHORIZATION INFORMATION Primary Insurance:   ID #:  Group #:  Pre-Cert / Auth required:  Pre-Cert / Auth #:   Secondary Insurance:   ID #:   Group #:  Pre-Cert / Auth required:  Pre-Cert / Auth #:   SCHEDULE INFORMATION: Procedure has been scheduled as follows:  Date:                   Time:   Location:   This Gastroenterology Pre-Precedure Review Form is being routed to the following provider(s): R. Garfield Cornea,  MD

## 2015-07-31 NOTE — Telephone Encounter (Signed)
Patient had TCS 2005 and 2009 with Dr. Delfin Edis. H/O adenomatous colon polyps 2005, hyperplastic 2009 according to Dr. Diona Browner colonoscopy report 2009. This would be a surveillance colonoscopy. Technically needs an office visit per protocol.

## 2015-07-31 NOTE — Telephone Encounter (Signed)
Pt is scheduled an OV with Walden Field, NP on 08/20/2015 at 2:30 pm, and she is aware.

## 2015-08-01 ENCOUNTER — Other Ambulatory Visit: Payer: Self-pay | Admitting: Internal Medicine

## 2015-08-01 ENCOUNTER — Encounter: Payer: Self-pay | Admitting: Internal Medicine

## 2015-08-01 DIAGNOSIS — E038 Other specified hypothyroidism: Secondary | ICD-10-CM

## 2015-08-01 MED ORDER — LEVOTHYROXINE SODIUM 125 MCG PO TABS: 125.0000 ug | ORAL_TABLET | Freq: Every day | ORAL | Status: DC

## 2015-08-01 NOTE — Telephone Encounter (Signed)
Refilled Levothyroxine per patient request. Prescription sent to pharmacy

## 2015-08-20 ENCOUNTER — Other Ambulatory Visit: Payer: Self-pay

## 2015-08-20 ENCOUNTER — Encounter: Payer: Self-pay | Admitting: Nurse Practitioner

## 2015-08-20 ENCOUNTER — Ambulatory Visit (INDEPENDENT_AMBULATORY_CARE_PROVIDER_SITE_OTHER): Payer: BLUE CROSS/BLUE SHIELD | Admitting: Nurse Practitioner

## 2015-08-20 VITALS — BP 104/70 | HR 73 | Temp 98.3°F | Ht 64.0 in | Wt 146.8 lb

## 2015-08-20 DIAGNOSIS — R1013 Epigastric pain: Secondary | ICD-10-CM

## 2015-08-20 DIAGNOSIS — Z86010 Personal history of colonic polyps: Secondary | ICD-10-CM

## 2015-08-20 MED ORDER — NA SULFATE-K SULFATE-MG SULF 17.5-3.13-1.6 GM/177ML PO SOLN: 1.0000 | Freq: Once | ORAL | Status: DC

## 2015-08-20 NOTE — Assessment & Plan Note (Signed)
Patient with a history of adenomatous polyps of the colon. Last colonoscopy 2009 which had one polyp which found to be hyperplastic and not adenomatous. Recommended repeat colonoscopy in 7 years which would be December 2016. She is currently due. She is generally asymptomatic from a GI standpoint except for mild dyspepsia as noted above. No red flag/warning signs or symptoms. We'll plan to proceed with surveillance colonoscopy.  Proceed with colonoscopy with Dr. Oneida Alar in the near future. The risks, benefits, and alternatives have been discussed in detail with the patient. They state understanding and desire to proceed.   Patient is not on any anticoagulants, anxiolytics, chronic pain medications, or antidepressants. Conscious sedation should be adequate for her procedure.

## 2015-08-20 NOTE — Progress Notes (Signed)
Primary Care Physician:  Smiley Houseman, MD Primary Gastroenterologist:  Dr. Oneida Alar  Chief Complaint  Patient presents with  . set up TCS    HPI:   55 year old female presents to schedule surveillance colonoscopy. Last colonoscopy completed 06/27/2008 which found a single diminutive polyp at 20 cm which was determined to be hyperplastic on surgical pathology. Previous colonoscopy before that in 2005 found to colon polyps deemed to be tubular adenoma. After last colonoscopy recommended repeat in 7 years. She is currently due.  Today she states she's doing well. Denies abdominal pain. Admits excessive belching and bloating which she attributes to her hiatal hernia. Denied hematochezia, melena, changes in bowel habits, unintentional weight loss. Bloating and belching is typically after eating, only occasionally. Uses ginger chews which she feels helps a lot. Denies GERD symptoms including bitter taste, esophageal burning. Admits to some epigastric tenderness at times and belching she can "taste her food." Typically has a bowel movement about 1-2 times a day, usually passes easily, occasionally hard. Takes Flax seed oil once a day. Denies chest pain, dyspnea, dizziness, lightheadedness, syncope, near syncope. Denies any other upper or lower GI symptoms.  Past Medical History  Diagnosis Date  . Thyroid disease   . Allergy   . Anemia   . Rotator cuff impingement syndrome   . Esophagitis   . Non-celiac gluten sensitivity   . Hyperplastic polyp of intestine     Past Surgical History  Procedure Laterality Date  . Appendectomy    . Colon surgery      Colonoscopy found polyps. Happened 2006    Current Outpatient Prescriptions  Medication Sig Dispense Refill  . Ascorbic Acid (VITAMIN C) 1000 MG tablet Take 1,000 mg by mouth daily.    . ferrous sulfate 325 (65 FE) MG tablet Take 1 tablet (325 mg total) by mouth daily. 30 tablet 3  . Flaxseed, Linseed, (FLAX SEED OIL PO) Take by  mouth.    . levothyroxine (SYNTHROID, LEVOTHROID) 125 MCG tablet Take 1 tablet (125 mcg total) by mouth daily. 30 tablet 6   No current facility-administered medications for this visit.    Allergies as of 08/20/2015 - Review Complete 08/20/2015  Allergen Reaction Noted  . Penicillins  04/01/2008    No family history on file.  Social History   Social History  . Marital Status: Married    Spouse Name: N/A  . Number of Children: N/A  . Years of Education: N/A   Occupational History  . Not on file.   Social History Main Topics  . Smoking status: Never Smoker   . Smokeless tobacco: Not on file  . Alcohol Use: No  . Drug Use: No  . Sexual Activity: Not Currently    Birth Control/ Protection: Post-menopausal   Other Topics Concern  . Not on file   Social History Narrative    Review of Systems: 10-point ROS negative except as per HPI.    Physical Exam: BP 104/70 mmHg  Pulse 73  Temp(Src) 98.3 F (36.8 C)  Ht 5\' 4"  (1.626 m)  Wt 146 lb 12.8 oz (66.588 kg)  BMI 25.19 kg/m2 General:   Alert and oriented. Pleasant and cooperative. Well-nourished and well-developed.  Head:  Normocephalic and atraumatic. Eyes:  Without icterus, sclera clear and conjunctiva pink.  Ears:  Normal auditory acuity. Cardiovascular:  S1, S2 present without murmurs appreciated. Extremities without clubbing or edema. Respiratory:  Clear to auscultation bilaterally. No wheezes, rales, or rhonchi. No distress.  Gastrointestinal:  +  BS, soft, and non-distended. Mild epigastric TTP. No HSM noted. No guarding or rebound. No masses appreciated.  Rectal:  Deferred  Musculoskalatal:  Symmetrical without gross deformities. Normal posture. Skin:  Intact without significant lesions or rashes. Neurologic:  Alert and oriented x4;  grossly normal neurologically. Psych:  Alert and cooperative. Normal mood and affect. Heme/Lymph/Immune: No significant cervical adenopathy. No excessive bruising  noted.    08/20/2015 2:31 PM

## 2015-08-20 NOTE — Assessment & Plan Note (Signed)
Patient notes dyspepsia-like symptoms including bloating, excess belching, and being able to taste or food after burping. She had previously been diagnosed with GERD and esophagitis although has not been on medication for a long time. Offered low-dose PPI, which she declined. She prefers more natural medications. Give her samples of FDgard to try. She will call us in a week and let us know if she would like to continue it at which point, per her request, we can write a prescription to see if insurance we'll reimburse otherwise she can obtain an over-the-counter with coupons we have provided her. Return for follow-up as needed.

## 2015-08-20 NOTE — Progress Notes (Signed)
CC'ED TO PCP 

## 2015-08-20 NOTE — Patient Instructions (Signed)
1. We will give you samples of FDgard for 1 week. Take 2 capsules, twice daily, 30 minutes before eating. Avoid using within 30 mins of Synthroid. 2. Call and let us know if you feel it helps and we can write a prescription for it to see if your insurance will pay for it. 3. We will schedule your procedure for you. 4. Further recommendations to be based on the results of your procedure.

## 2015-08-25 ENCOUNTER — Telehealth: Payer: Self-pay | Admitting: Gastroenterology

## 2015-08-25 NOTE — Telephone Encounter (Signed)
Spoke with pt and she is aware of prep here at the office to be picked up.

## 2015-08-25 NOTE — Telephone Encounter (Signed)
Pt is scheduled for tcs with SF on 1/27 and her prep is too expensive. She was calling to see if we had a sample prep. [phone]

## 2015-09-12 ENCOUNTER — Encounter (HOSPITAL_COMMUNITY): Payer: Self-pay | Admitting: *Deleted

## 2015-09-12 ENCOUNTER — Ambulatory Visit (HOSPITAL_COMMUNITY)
Admission: RE | Admit: 2015-09-12 | Discharge: 2015-09-12 | Disposition: A | Discharge status: Home / Self Care | Payer: BLUE CROSS/BLUE SHIELD | Source: Ambulatory Visit | Attending: Gastroenterology | Admitting: Gastroenterology

## 2015-09-12 ENCOUNTER — Encounter (HOSPITAL_COMMUNITY)
Admission: RE | Disposition: A | Discharge status: Home / Self Care | Payer: Self-pay | Source: Ambulatory Visit | Attending: Gastroenterology

## 2015-09-12 DIAGNOSIS — E079 Disorder of thyroid, unspecified: Secondary | ICD-10-CM | POA: Insufficient documentation

## 2015-09-12 DIAGNOSIS — Q438 Other specified congenital malformations of intestine: Secondary | ICD-10-CM | POA: Insufficient documentation

## 2015-09-12 DIAGNOSIS — Z860101 Personal history of adenomatous and serrated colon polyps: Secondary | ICD-10-CM

## 2015-09-12 DIAGNOSIS — Z86010 Personal history of colonic polyps: Secondary | ICD-10-CM

## 2015-09-12 DIAGNOSIS — D120 Benign neoplasm of cecum: Secondary | ICD-10-CM | POA: Insufficient documentation

## 2015-09-12 DIAGNOSIS — K648 Other hemorrhoids: Secondary | ICD-10-CM | POA: Diagnosis not present

## 2015-09-12 DIAGNOSIS — Z1211 Encounter for screening for malignant neoplasm of colon: Secondary | ICD-10-CM

## 2015-09-12 DIAGNOSIS — Z79899 Other long term (current) drug therapy: Secondary | ICD-10-CM | POA: Insufficient documentation

## 2015-09-12 HISTORY — PX: COLONOSCOPY: SHX5424

## 2015-09-12 LAB — CBC
HCT: 37.4 % (ref 36.0–46.0)
Hemoglobin: 12 g/dL (ref 12.0–15.0)
MCH: 25.3 pg — ABNORMAL LOW (ref 26.0–34.0)
MCHC: 32.1 g/dL (ref 30.0–36.0)
MCV: 78.9 fL (ref 78.0–100.0)
Platelets: 165 10*3/uL (ref 150–400)
RBC: 4.74 MIL/uL (ref 3.87–5.11)
RDW: 15.6 % — ABNORMAL HIGH (ref 11.5–15.5)
WBC: 4.2 10*3/uL (ref 4.0–10.5)

## 2015-09-12 LAB — FERRITIN: Ferritin: 22 ng/mL (ref 11–307)

## 2015-09-12 SURGERY — COLONOSCOPY
Anesthesia: Moderate Sedation

## 2015-09-12 MED ORDER — MIDAZOLAM HCL 5 MG/5ML IJ SOLN
INTRAMUSCULAR | Status: AC
  Filled 2015-09-12: qty 10

## 2015-09-12 MED ORDER — SODIUM CHLORIDE 0.9 % IV SOLN: INTRAVENOUS | Status: DC

## 2015-09-12 MED ORDER — MIDAZOLAM HCL 5 MG/5ML IJ SOLN
INTRAMUSCULAR | Status: DC | PRN
  Administered 2015-09-12: 1 mg via INTRAVENOUS
  Administered 2015-09-12 (×2): 2 mg via INTRAVENOUS

## 2015-09-12 MED ORDER — MEPERIDINE HCL 100 MG/ML IJ SOLN
INTRAMUSCULAR | Status: AC
  Filled 2015-09-12: qty 2

## 2015-09-12 MED ORDER — MEPERIDINE HCL 100 MG/ML IJ SOLN
INTRAMUSCULAR | Status: DC | PRN
  Administered 2015-09-12 (×4): 25 mg via INTRAVENOUS

## 2015-09-12 MED ORDER — STERILE WATER FOR IRRIGATION IR SOLN
Status: DC | PRN
  Administered 2015-09-12: 12:00:00

## 2015-09-12 NOTE — Op Note (Signed)
[facility] [address] [place], [address]   COLONOSCOPY PROCEDURE REPORT  PATIENT: Leah Cuevas, Leah Cuevas  MR#: [id number] BIRTHDATE: 1961/01/08 , 25  yrs. old GENDER: female ENDOSCOPIST: Danie Binder, MD REFERRED BY:   Smiley Houseman, MD PROCEDURE DATE:  23-Sep-2015 PROCEDURE:   Colonoscopy with cold biopsy polypectomy INDICATIONS:average risk patient for colon cancer.  TCS 2006: HYPERPLASTIC POLYP. TAKES FESO4 SINCE NOV 2016. MEDICATIONS: Demerol 100 mg IV and Versed 5 mg IV  DESCRIPTION OF PROCEDURE:    Physical exam was performed.  Informed consent was obtained from the patient after explaining the benefits, risks, and alternatives to procedure.  The patient was connected to monitor and placed in left lateral position. Continuous oxygen was provided by nasal cannula and IV medicine administered through an indwelling cannula.  After administration of sedation and rectal exam, the patients rectum was intubated and the [id number]  colonoscope was advanced under direct visualization to the ileum.  The scope was removed slowly by carefully examining the color, texture, anatomy, and integrity mucosa on the way out.  The patient was recovered in endoscopy and discharged home in satisfactory condition. Estimated blood loss is zero unless otherwise noted in this procedure report.    COLON FINDINGS: The examined terminal ileum appeared to be normal. , A sessile polyp measuring 3 mm in size was found at the cecum.  A polypectomy was performed with cold forceps.  , The colon was redundant.  , and Moderate sized internal hemorrhoids were found.   PREP QUALITY: excellent.  CECAL W/D TIME: 10       minutes COMPLICATIONS: None  ENDOSCOPIC IMPRESSION: 1.   NORMAL ILEUM 2.   ONE COLON Polyp REMOVED 3.   The LEFT colon IS SLIGHTLY redundant 4.   Moderate sized internal hemorrhoids  RECOMMENDATIONS: AWAIT BIOPSY CHECK CBC/FERRITIN.  CONSIDER EGD. NEXT  TCS IN 5-10 YEARS      _______________________________ eSignedDanie Binder, MD 09-23-2015 12:29 PM   CPT CODES: ICD CODES:  The ICD and CPT codes recommended by this software are interpretations from the data that the clinical staff has captured with the software.  The verification of the translation of this report to the ICD and CPT codes and modifiers is the sole responsibility of the health care institution and practicing physician where this report was generated.  [employer]. will not be held responsible for the validity of the ICD and CPT codes included on this report.  AMA assumes no liability for data contained or not contained herein. CPT is a Designer, television/film set of the [employer].

## 2015-09-12 NOTE — Progress Notes (Signed)
REVIEWED-NO ADDITIONAL RECOMMENDATIONS. 

## 2015-09-12 NOTE — H&P (Addendum)
  Primary Care Physician:  Smiley Houseman, MD Primary Gastroenterologist:  Dr. Oneida Alar  Pre-Procedure History & Physical: HPI:  Leah Cuevas is a 55 y.o. female here for [place].  Past Medical History  Diagnosis Date  . Thyroid disease   . Allergy   . Anemia   . Rotator cuff impingement syndrome   . Esophagitis   . Non-celiac gluten sensitivity   . Hyperplastic polyp of intestine     Past Surgical History  Procedure Laterality Date  . Appendectomy    . Colonoscopy  2006 DB    HYPERPLASTIC POLYP  . Cesarean section  1998  . Tendon reattachment  1999    Right hand  . Dilation and curettage of uterus  1997    s/p spontaneous abortion  . Exploratory laproscopy      Dx PID    Prior to Admission medications   Medication Sig Start Date End Date Taking? Authorizing Provider  Ascorbic Acid (VITAMIN C) 1000 MG tablet Take 1,000 mg by mouth daily.   Yes Historical Provider, MD  ferrous sulfate 325 (65 FE) MG tablet Take 1 tablet (325 mg total) by mouth daily. 06/26/15  Yes Smiley Houseman, MD  Flaxseed, Linseed, (FLAX SEED OIL PO) Take 30 mLs by mouth daily.    Yes Historical Provider, MD  levothyroxine (SYNTHROID, LEVOTHROID) 125 MCG tablet Take 1 tablet (125 mcg total) by mouth daily. 08/01/15  Yes Smiley Houseman, MD  Na Sulfate-K Sulfate-Mg Sulf SOLN Take 1 Container by mouth once. 08/20/15 09/19/15 Yes Carlis Stable, NP    Allergies as of 08/20/2015 - Review Complete 08/20/2015  Allergen Reaction Noted  . Penicillins  04/01/2008    Family History  Problem Relation Age of Onset  . Colon cancer Paternal Uncle   . Coronary artery disease Mother   . Lung cancer Father   . Heart Problems Brother   . Hypertension Brother   . Heart Problems Brother   . Hypertension Brother     Social History   Social History  . Marital Status: Married    Spouse Name: N/A  . Number of Children: N/A  . Years of Education: N/A   Occupational History  . Not  on file.   Social History Main Topics  . Smoking status: Never Smoker   . Smokeless tobacco: Never Used  . Alcohol Use: No  . Drug Use: No  . Sexual Activity: Not Currently    Birth Control/ Protection: Post-menopausal   Other Topics Concern  . Not on file   Social History Narrative    Review of Systems: See HPI, otherwise negative ROS   Physical Exam: BP 131/82 mmHg  Pulse 50  Temp(Src) 98 F (36.7 C) (Oral)  Resp 11  Ht 5\' 4"  (1.626 m)  Wt 144 lb (65.318 kg)  BMI 24.71 kg/m2  SpO2 100% General:   Alert,  pleasant and cooperative in NAD Head:  Normocephalic and atraumatic. Neck:  Supple; Lungs:  Clear throughout to auscultation.    Heart:  Regular rate and rhythm. Abdomen:  Soft, nontender and nondistended. Normal bowel sounds, without guarding, and without rebound.   Neurologic:  Alert and  oriented x4;  grossly normal neurologically.  Impression/Plan:    screening  PLAN:  Tcs TODAY

## 2015-09-12 NOTE — Discharge Instructions (Signed)
You had one small polyp removed FROM YOUR CECUM. You have internal hemorrhoids.   DRINK WATER TO KEEP YOUR URINE LIGHT YELLOW.  FOLLOW A HIGH FIBER DIET. AVOID ITEMS THAT CAUSE BLOATING & GAS. SEE INFO BELOW.  YOUR BIOPSY AND LAB RESULTS WILL BE AVAILABLE IN MY CHART JAN 31 AND MY OFFICE WILL CONTACT YOU IN 10-14 DAYS WITH YOUR RESULTS.   Next colonoscopy in 5-10 years.    Colonoscopy Care After Read the instructions outlined below and refer to this sheet in the next week. These discharge instructions provide you with general information on caring for yourself after you leave the hospital. While your treatment has been planned according to the most current medical practices available, unavoidable complications occasionally occur. If you have any problems or questions after discharge, call DR. Jessia Kief, [phone].  ACTIVITY  You may resume your regular activity, but move at a slower pace for the next 24 hours.   Take frequent rest periods for the next 24 hours.   Walking will help get rid of the air and reduce the bloated feeling in your belly (abdomen).   No driving for 24 hours (because of the medicine (anesthesia) used during the test).   You may shower.   Do not sign any important legal documents or operate any machinery for 24 hours (because of the anesthesia used during the test).    NUTRITION  Drink plenty of fluids.   You may resume your normal diet as instructed by your doctor.   Begin with a light meal and progress to your normal diet. Heavy or fried foods are harder to digest and may make you feel sick to your stomach (nauseated).   Avoid alcoholic beverages for 24 hours or as instructed.    MEDICATIONS  You may resume your normal medications.   WHAT YOU CAN EXPECT TODAY  Some feelings of bloating in the abdomen.   Passage of more gas than usual.   Spotting of blood in your stool or on the toilet paper  .  IF YOU HAD POLYPS REMOVED DURING THE  COLONOSCOPY:  Eat a soft diet IF YOU HAVE NAUSEA, BLOATING, ABDOMINAL PAIN, OR VOMITING.    FINDING OUT THE RESULTS OF YOUR TEST Not all test results are available during your visit. DR. Oneida Alar WILL CALL YOU WITHIN 14 DAYS OF YOUR PROCEDUE WITH YOUR RESULTS. Do not assume everything is normal if you have not heard from DR. Caven Perine, CALL HER OFFICE AT [phone].  SEEK IMMEDIATE MEDICAL ATTENTION AND CALL THE OFFICE: [phone] IF:  You have more than a spotting of blood in your stool.   Your belly is swollen (abdominal distention).   You are nauseated or vomiting.   You have a temperature over 101F.   You have abdominal pain or discomfort that is severe or gets worse throughout the day.   High-Fiber Diet A high-fiber diet changes your normal diet to include more whole grains, legumes, fruits, and vegetables. Changes in the diet involve replacing refined carbohydrates with unrefined foods. The calorie level of the diet is essentially unchanged. The Dietary Reference Intake (recommended amount) for adult males is 38 grams per day. For adult females, it is 25 grams per day. Pregnant and lactating women should consume 28 grams of fiber per day. Fiber is the intact part of a plant that is not broken down during digestion. Functional fiber is fiber that has been isolated from the plant to provide a beneficial effect in the body.  PURPOSE  Increase  stool bulk.   Ease and regulate bowel movements.   Lower cholesterol.  REDUCE RISK OF COLON CANCER  INDICATIONS THAT YOU NEED MORE FIBER  Constipation and hemorrhoids.   Uncomplicated diverticulosis (intestine condition) and irritable bowel syndrome.   Weight management.   As a protective measure against hardening of the arteries (atherosclerosis), diabetes, and cancer.   GUIDELINES FOR INCREASING FIBER IN THE DIET  Start adding fiber to the diet slowly. A gradual increase of about 5 more grams (2 slices of whole-wheat bread, 2  servings of most fruits or vegetables, or 1 bowl of high-fiber cereal) per day is best. Too rapid an increase in fiber may result in constipation, flatulence, and bloating.   Drink enough water and fluids to keep your urine clear or pale yellow. Water, juice, or caffeine-free drinks are recommended. Not drinking enough fluid may cause constipation.   Eat a variety of high-fiber foods rather than one type of fiber.   Try to increase your intake of fiber through using high-fiber foods rather than fiber pills or supplements that contain small amounts of fiber.   The goal is to change the types of food eaten. Do not supplement your present diet with high-fiber foods, but replace foods in your present diet.   INCLUDE A VARIETY OF FIBER SOURCES  Replace refined and processed grains with whole grains, canned fruits with fresh fruits, and incorporate other fiber sources. White rice, white breads, and most bakery goods contain little or no fiber.   Brown whole-grain rice, buckwheat oats, and many fruits and vegetables are all good sources of fiber. These include: broccoli, Brussels sprouts, cabbage, cauliflower, beets, sweet potatoes, white potatoes (skin on), carrots, tomatoes, eggplant, squash, berries, fresh fruits, and dried fruits.   Cereals appear to be the richest source of fiber. Cereal fiber is found in whole grains and bran. Bran is the fiber-rich outer coat of cereal grain, which is largely removed in refining. In whole-grain cereals, the bran remains. In breakfast cereals, the largest amount of fiber is found in those with "bran" in their names. The fiber content is sometimes indicated on the label.   You may need to include additional fruits and vegetables each day.   In baking, for 1 cup white flour, you may use the following substitutions:   1 cup whole-wheat flour minus 2 tablespoons.   1/2 cup white flour plus 1/2 cup whole-wheat flour.   Polyps, Colon  A polyp is extra tissue that  grows inside your body. Colon polyps grow in the large intestine. The large intestine, also called the colon, is part of your digestive system. It is a long, hollow tube at the end of your digestive tract where your body makes and stores stool. Most polyps are not dangerous. They are benign. This means they are not cancerous. But over time, some types of polyps can turn into cancer. Polyps that are smaller than a pea are usually not harmful. But larger polyps could someday become or may already be cancerous. To be safe, doctors remove all polyps and test them.   PREVENTION There is not one sure way to prevent polyps. You might be able to lower your risk of getting them if you:  Eat more fruits and vegetables and less fatty food.   Do not smoke.   Avoid alcohol.   Exercise every day.   Lose weight if you are overweight.   Eating more calcium and folate can also lower your risk of getting polyps. Some foods  that are rich in calcium are milk, cheese, and broccoli. Some foods that are rich in folate are chickpeas, kidney beans, and spinach.   Hemorrhoids Hemorrhoids are dilated (enlarged) veins around the rectum. Sometimes clots will form in the veins. This makes them swollen and painful. These are called thrombosed hemorrhoids. Causes of hemorrhoids include:  Constipation.   Straining to have a bowel movement.   HEAVY LIFTING  HOME CARE INSTRUCTIONS  Eat a well balanced diet and drink 6 to 8 glasses of water every day to avoid constipation. You may also use a bulk laxative.   Avoid straining to have bowel movements.   Keep anal area dry and clean.   Do not use a donut shaped pillow or sit on the toilet for long periods. This increases blood pooling and pain.   Move your bowels when your body has the urge; this will require less straining and will decrease pain and pressure.

## 2015-09-15 ENCOUNTER — Encounter (HOSPITAL_COMMUNITY): Payer: Self-pay | Admitting: Gastroenterology

## 2015-09-17 ENCOUNTER — Telehealth: Payer: Self-pay

## 2015-09-17 NOTE — Telephone Encounter (Signed)
Pt is calling to see if she needs to have the EGD done and if so what for. Please advise

## 2015-09-18 ENCOUNTER — Encounter: Payer: Self-pay | Admitting: Internal Medicine

## 2015-09-18 ENCOUNTER — Encounter: Payer: Self-pay | Admitting: Gastroenterology

## 2015-09-18 NOTE — Telephone Encounter (Signed)
Please call pt. SHE had ONE 3 MM simple adenoma removed from HER colon. SHE NEEDS AN EGD TO EVALUATE HER LOW BLOOD AND IRON STORES &  BECAUSE SHE HAS TO TAKE IRON PILLS TO KEEP HER BLOOD COUNT NORMAL. SHE SHOULD NOT NEEDS IRON PILLS AND IT INDICATES SHE MAY HAVE A SOURCE FOR BLOOD LOSS IN HER ESOPHAGUS, STOMACH, OR SMALL BOWEL. IT NEEDS TO BE EVALUATED STARTING WITH AN UPPER ENDOSCOPY. HER BLOOD COUNT AND IRON STORES ARE NORMAL NOW BUT IN JUN 2016 SHE HAD A LOW BLOOD COUNT AND LOW IRON STORES(FERRITIN 5).  SHE SHOULD: DRINK WATER TO KEEP YOUR URINE LIGHT YELLOW. FOLLOW A HIGH FIBER DIET. AVOID ITEMS THAT CAUSE BLOATING & GAS. SEE INFO BELOW. Next colonoscopy in 5-10 years @ [facility] Colonoscopy Surveillance Guidelines OCT 2012.  OPV IN 4 MOS E30 FEDA/BELCHIN/BLOATING

## 2015-09-18 NOTE — Telephone Encounter (Signed)
REMINDERS IN EPIC °

## 2015-09-18 NOTE — Telephone Encounter (Signed)
APPT MADE AND LETTER SENT  °

## 2015-09-22 ENCOUNTER — Other Ambulatory Visit: Payer: Self-pay

## 2015-09-22 DIAGNOSIS — D729 Disorder of white blood cells, unspecified: Secondary | ICD-10-CM

## 2015-09-22 DIAGNOSIS — D72818 Other decreased white blood cell count: Secondary | ICD-10-CM

## 2015-09-22 NOTE — Telephone Encounter (Signed)
Pt is aware of results and she is set up for EGD on 10/17/15 @ 8:30. Instructions are in the mail

## 2015-10-17 ENCOUNTER — Ambulatory Visit (HOSPITAL_COMMUNITY)
Admission: RE | Admit: 2015-10-17 | Discharge: 2015-10-17 | Disposition: A | Discharge status: Home Health | Payer: BLUE CROSS/BLUE SHIELD | Source: Ambulatory Visit | Attending: Gastroenterology | Admitting: Gastroenterology

## 2015-10-17 ENCOUNTER — Encounter (HOSPITAL_COMMUNITY): Payer: Self-pay

## 2015-10-17 ENCOUNTER — Telehealth: Payer: Self-pay | Admitting: Gastroenterology

## 2015-10-17 ENCOUNTER — Encounter (HOSPITAL_COMMUNITY)
Admission: RE | Disposition: A | Discharge status: Home Health | Payer: Self-pay | Source: Ambulatory Visit | Attending: Gastroenterology

## 2015-10-17 DIAGNOSIS — B9681 Helicobacter pylori [H. pylori] as the cause of diseases classified elsewhere: Secondary | ICD-10-CM | POA: Insufficient documentation

## 2015-10-17 DIAGNOSIS — E079 Disorder of thyroid, unspecified: Secondary | ICD-10-CM | POA: Insufficient documentation

## 2015-10-17 DIAGNOSIS — D72818 Other decreased white blood cell count: Secondary | ICD-10-CM

## 2015-10-17 DIAGNOSIS — D509 Iron deficiency anemia, unspecified: Secondary | ICD-10-CM | POA: Insufficient documentation

## 2015-10-17 DIAGNOSIS — K2970 Gastritis, unspecified, without bleeding: Secondary | ICD-10-CM

## 2015-10-17 DIAGNOSIS — Z79899 Other long term (current) drug therapy: Secondary | ICD-10-CM | POA: Insufficient documentation

## 2015-10-17 DIAGNOSIS — D729 Disorder of white blood cells, unspecified: Secondary | ICD-10-CM

## 2015-10-17 DIAGNOSIS — K2950 Unspecified chronic gastritis without bleeding: Secondary | ICD-10-CM | POA: Diagnosis not present

## 2015-10-17 HISTORY — PX: ESOPHAGOGASTRODUODENOSCOPY: SHX5428

## 2015-10-17 SURGERY — EGD (ESOPHAGOGASTRODUODENOSCOPY)
Anesthesia: Moderate Sedation

## 2015-10-17 MED ORDER — MIDAZOLAM HCL 5 MG/5ML IJ SOLN
INTRAMUSCULAR | Status: AC
  Filled 2015-10-17: qty 10

## 2015-10-17 MED ORDER — MEPERIDINE HCL 100 MG/ML IJ SOLN
INTRAMUSCULAR | Status: AC
  Filled 2015-10-17: qty 2

## 2015-10-17 MED ORDER — LIDOCAINE VISCOUS 2 % MT SOLN
OROMUCOSAL | Status: DC | PRN
  Administered 2015-10-17: 1 via OROMUCOSAL

## 2015-10-17 MED ORDER — LIDOCAINE VISCOUS 2 % MT SOLN
OROMUCOSAL | Status: AC
  Filled 2015-10-17: qty 15

## 2015-10-17 MED ORDER — MEPERIDINE HCL 100 MG/ML IJ SOLN
INTRAMUSCULAR | Status: DC | PRN
  Administered 2015-10-17: 25 mg via INTRAVENOUS
  Administered 2015-10-17: 50 mg via INTRAVENOUS

## 2015-10-17 MED ORDER — MIDAZOLAM HCL 5 MG/5ML IJ SOLN
INTRAMUSCULAR | Status: DC | PRN
  Administered 2015-10-17 (×2): 2 mg via INTRAVENOUS

## 2015-10-17 MED ORDER — STERILE WATER FOR IRRIGATION IR SOLN
Status: DC | PRN
  Administered 2015-10-17: 09:00:00

## 2015-10-17 MED ORDER — SODIUM CHLORIDE 0.9 % IV SOLN
INTRAVENOUS | Status: DC
  Administered 2015-10-17: 08:00:00 via INTRAVENOUS

## 2015-10-17 NOTE — Op Note (Signed)
[facility] [address] [place], [address]   ENDOSCOPY PROCEDURE REPORT  PATIENT: Leah Cuevas, Leah Cuevas  MR#: [id number] BIRTHDATE: 1960/11/14 , 58  yrs. old GENDER: female  ENDOSCOPIST: Danie Binder, MD REFERRED EO:[id number] Hall, M.D.  PROCEDURE DATE: 15-Nov-2015 PROCEDURE:   EGD w/ biopsy  INDICATIONS:iron deficiency anemia. JUL 2016: FERRITIN 5, Hb 11.4 MCV 73.4 MEDICATIONS: Demerol 75 mg IV and Versed 4 mg IV  MD STARTED SEDATION: [id number] PROCEDURE COMPLETE: 0908 TOPICAL ANESTHETIC:   Viscous Xylocaine  ASA CLASS:  DESCRIPTION OF PROCEDURE:     Physical exam was performed.  Informed consent was obtained from the patient after explaining the benefits, risks, and alternatives to the procedure.  The patient was connected to the monitor and placed in the left lateral position.  Continuous oxygen was provided by nasal cannula and IV medicine administered through an indwelling cannula.  After administration of sedation, the patients esophagus was intubated and the [id number]  endoscope was advanced under direct visualization to the second portion of the duodenum.  The scope was removed slowly by carefully examining the color, texture, anatomy, and integrity of the mucosa on the way out.  The patient was recovered in endoscopy and discharged home in satisfactory condition.  Estimated blood loss is zero unless otherwise noted in this procedure report.     ESOPHAGUS: The mucosa of the esophagus appeared normal.   STOMACH: Mild non-erosive gastritis (inflammation) was found on the greater curve of the stomach and in the gastric antrum.  Multiple biopsies were performed using cold forceps.   DUODENUM: The duodenal mucosa showed no abnormalities in the bulb and 2nd part of the duodenum. Cold forceps biopsies were taken in the bulb and second portion. COMPLICATIONS: There were no immediate complications.  ENDOSCOPIC IMPRESSION: 1.   NO OBVIOUS SOURCE FOR FEDA  IDENTIFIED. 2.   MILD Non-erosive gastritis  RECOMMENDATIONS: HOLD IRON.  REPEAT CBC/FERRITIN IN APR 2017. AWAIT BIOPSY OPV IN JUN 2017  REPEAT EXAM:   _______________________________ eSignedDanie Binder, MD 11-15-15 9:43 AM    CPT CODES: ICD CODES:  The ICD and CPT codes recommended by this software are interpretations from the data that the clinical staff has captured with the software.  The verification of the translation of this report to the ICD and CPT codes and modifiers is the sole responsibility of the health care institution and practicing physician where this report was generated.  [employer] will not be held responsible for the validity of the ICD and CPT codes included on this report.  AMA assumes no liability for data contained or not contained herein. CPT is a Designer, television/film set of the [employer].

## 2015-10-17 NOTE — Telephone Encounter (Signed)
Short Stay called to make follow up visit and also said that the patient will need to recheck her labs (ferritin, iron count) the end of April

## 2015-10-17 NOTE — H&P (Signed)
  Primary Care Physician:  Smiley Houseman, MD Primary Gastroenterologist:  Dr. Oneida Alar  Pre-Procedure History & Physical: HPI:  Leah Cuevas is a 55 y.o. female here for ANEMIA.  Past Medical History  Diagnosis Date  . Thyroid disease   . Allergy   . Anemia   . Rotator cuff impingement syndrome   . Esophagitis   . Non-celiac gluten sensitivity   . Hyperplastic polyp of intestine     Past Surgical History  Procedure Laterality Date  . Appendectomy    . Colonoscopy  2006 DB    HYPERPLASTIC POLYP  . Cesarean section  1998  . Tendon reattachment  1999    Right hand  . Dilation and curettage of uterus  1997    s/p spontaneous abortion  . Exploratory laproscopy      Dx PID  . Colonoscopy N/A 09/12/2015    Procedure: COLONOSCOPY;  Surgeon: Danie Binder, MD;  Location: [facility];  Service: Endoscopy;  Laterality: N/A;  1115     Prior to Admission medications   Medication Sig Start Date End Date Taking? Authorizing Provider  levothyroxine (SYNTHROID, LEVOTHROID) 125 MCG tablet Take 1 tablet (125 mcg total) by mouth daily. 08/01/15  Yes Smiley Houseman, MD  MELATONIN GUMMIES PO Take 2 tablets by mouth at bedtime.   Yes Historical Provider, MD    Allergies as of 09/22/2015 - Review Complete 09/12/2015  Allergen Reaction Noted  . Penicillins  04/01/2008    Family History  Problem Relation Age of Onset  . Colon cancer Paternal Uncle   . Coronary artery disease Mother   . Lung cancer Father   . Heart Problems Brother   . Hypertension Brother   . Heart Problems Brother   . Hypertension Brother     Social History   Social History  . Marital Status: Married    Spouse Name: N/A  . Number of Children: N/A  . Years of Education: N/A   Occupational History  . Not on file.   Social History Main Topics  . Smoking status: Never Smoker   . Smokeless tobacco: Never Used  . Alcohol Use: No  . Drug Use: No  . Sexual Activity: Not Currently    Birth  Control/ Protection: Post-menopausal   Other Topics Concern  . Not on file   Social History Narrative    Review of Systems: See HPI, otherwise negative ROS   Physical Exam: BP 118/74 mmHg  Pulse 60  Temp(Src) 98 F (36.7 C) (Oral)  Resp 13  Ht 5\' 5"  (1.651 m)  Wt 139 lb (63.05 kg)  BMI 23.13 kg/m2  SpO2 100% General:   Alert,  pleasant and cooperative in NAD Head:  Normocephalic and atraumatic. Neck:  Supple; Lungs:  Clear throughout to auscultation.    Heart:  Regular rate and rhythm. Abdomen:  Soft, nontender and nondistended. Normal bowel sounds, without guarding, and without rebound.   Neurologic:  Alert and  oriented x4;  grossly normal neurologically.  Impression/Plan:     ANEMIA  PLAN:  EGD TODAY

## 2015-10-17 NOTE — Discharge Instructions (Signed)
You have mild gastritis. I biopsied your stomach and duodenum.  HOLD IRON. WE WILL RECHECK YOUR FERRITIN(IRON STORES) & BLOOD COUNT AT THE END OF April.  YOUR BIOPSY RESULTS WILL BE AVAILABLE IN MY CHART MAR 8 AND MY OFFICE WILL CONTACT YOU IN 10-14 DAYS WITH YOUR RESULTS.   FOLLOW UP IN June 2017.  UPPER ENDOSCOPY AFTER CARE Read the instructions outlined below and refer to this sheet in the next week. These discharge instructions provide you with general information on caring for yourself after you leave the hospital. While your treatment has been planned according to the most current medical practices available, unavoidable complications occasionally occur. If you have any problems or questions after discharge, call DR. Lequisha Cammack, [phone].  ACTIVITY  You may resume your regular activity, but move at a slower pace for the next 24 hours.   Take frequent rest periods for the next 24 hours.   Walking will help get rid of the air and reduce the bloated feeling in your belly (abdomen).   No driving for 24 hours (because of the medicine (anesthesia) used during the test).   You may shower.   Do not sign any important legal documents or operate any machinery for 24 hours (because of the anesthesia used during the test).    NUTRITION  Drink plenty of fluids.   You may resume your normal diet as instructed by your doctor.   Begin with a light meal and progress to your normal diet. Heavy or fried foods are harder to digest and may make you feel sick to your stomach (nauseated).   Avoid alcoholic beverages for 24 hours or as instructed.    MEDICATIONS  You may resume your normal medications.   WHAT YOU CAN EXPECT TODAY  Some feelings of bloating in the abdomen.   Passage of more gas than usual.    IF YOU HAD A BIOPSY TAKEN DURING THE UPPER ENDOSCOPY:  Eat a soft diet IF YOU HAVE NAUSEA, BLOATING, ABDOMINAL PAIN, OR VOMITING.    FINDING OUT THE RESULTS OF YOUR  TEST Not all test results are available during your visit. DR. Oneida Alar WILL CALL YOU WITHIN 14 DAYS OF YOUR PROCEDUE WITH YOUR RESULTS. Do not assume everything is normal if you have not heard from DR. Chela Sutphen, CALL HER OFFICE AT [phone].  SEEK IMMEDIATE MEDICAL ATTENTION AND CALL THE OFFICE: [phone] IF:  You have more than a spotting of blood in your stool.   Your belly is swollen (abdominal distention).   You are nauseated or vomiting.   You have a temperature over 101F.   You have abdominal pain or discomfort that is severe or gets worse throughout the day.   Gastritis  Gastritis is an inflammation (the body's way of reacting to injury and/or infection) of the stomach. It is often caused by viral or bacterial (germ) infections. It can also be caused BY ASPIRIN, BC/GOODY POWDER'S, (IBUPROFEN) MOTRIN, OR ALEVE (NAPROXEN), chemicals (including alcohol), SPICY FOODS, and medications. This illness may be associated with generalized malaise (feeling tired, not well), UPPER ABDOMINAL STOMACH cramps, and fever. One common bacterial cause of gastritis is an organism known as H. Pylori. This can be treated with antibiotics.

## 2015-10-20 ENCOUNTER — Other Ambulatory Visit: Payer: Self-pay

## 2015-10-20 DIAGNOSIS — D649 Anemia, unspecified: Secondary | ICD-10-CM

## 2015-10-20 NOTE — Telephone Encounter (Signed)
Lab orders on file for 12/09/2015.

## 2015-10-21 ENCOUNTER — Encounter (HOSPITAL_COMMUNITY): Payer: Self-pay | Admitting: Gastroenterology

## 2015-11-06 ENCOUNTER — Telehealth: Payer: Self-pay | Admitting: Gastroenterology

## 2015-11-06 MED ORDER — PANTOPRAZOLE SODIUM 40 MG PO TBEC: DELAYED_RELEASE_TABLET | ORAL | Status: DC

## 2015-11-06 MED ORDER — BIS SUBCIT-METRONID-TETRACYC 140-125-125 MG PO CAPS: ORAL_CAPSULE | ORAL | Status: DC

## 2015-11-06 NOTE — Addendum Note (Signed)
Addended by: Danie Binder on: 11/06/2015 03:11 PM   Modules accepted: Orders

## 2015-11-06 NOTE — Telephone Encounter (Signed)
APPT MADE

## 2015-11-06 NOTE — Telephone Encounter (Signed)
Pt is aware.  

## 2015-11-06 NOTE — Telephone Encounter (Signed)
PLEASE CALL PT. She has H. Pylori gastritis. It can cause LOW IRON. She has an allergy to PCN and so she needs PYLERA 3 PILLS QID FOR 10 DAYS. She should take PROTONIX BID for 10 days the once daily FOR 3 MOS. The meds can cause nausea, vomiting, abd cramps, loose stools, black colored stools, and metallic taste in her mouth.  FOLLOW UP IN June 2017 E30 FEDA/H PYLORI GASTRITIS.

## 2015-11-07 NOTE — Telephone Encounter (Signed)
Pt called and said the copay for the Pylera would be $100.00. She asked for a sample and I am leaving her sample at front with instructions. She will pick up Monday.  She is aware to take the Protonix bid for the 10 days that she is on the Pylera.

## 2015-11-11 NOTE — Telephone Encounter (Signed)
REVIEWED-NO ADDITIONAL RECOMMENDATIONS. 

## 2015-11-14 ENCOUNTER — Other Ambulatory Visit: Payer: Self-pay

## 2015-11-14 DIAGNOSIS — D649 Anemia, unspecified: Secondary | ICD-10-CM

## 2015-12-05 DIAGNOSIS — D649 Anemia, unspecified: Secondary | ICD-10-CM | POA: Diagnosis not present

## 2015-12-06 LAB — FERRITIN: Ferritin: 25 ng/mL (ref 10–232)

## 2015-12-06 LAB — IRON: Iron: 99 ug/dL (ref 45–160)

## 2015-12-08 ENCOUNTER — Other Ambulatory Visit: Payer: Self-pay

## 2015-12-08 DIAGNOSIS — D649 Anemia, unspecified: Secondary | ICD-10-CM

## 2015-12-08 NOTE — Progress Notes (Signed)
Quick Note:  Lab order on file for 01/15/2016. ______

## 2015-12-31 DIAGNOSIS — Z1211 Encounter for screening for malignant neoplasm of colon: Secondary | ICD-10-CM | POA: Diagnosis not present

## 2015-12-31 DIAGNOSIS — M542 Cervicalgia: Secondary | ICD-10-CM | POA: Diagnosis not present

## 2016-01-05 ENCOUNTER — Other Ambulatory Visit: Payer: Self-pay

## 2016-01-05 DIAGNOSIS — D649 Anemia, unspecified: Secondary | ICD-10-CM

## 2016-01-15 DIAGNOSIS — D649 Anemia, unspecified: Secondary | ICD-10-CM | POA: Diagnosis not present

## 2016-01-15 LAB — CBC WITH DIFFERENTIAL/PLATELET
Basophils Absolute: 114 cells/uL (ref 0–200)
Basophils Relative: 2 %
Eosinophils Absolute: 513 cells/uL — ABNORMAL HIGH (ref 15–500)
Eosinophils Relative: 9 %
HCT: 37.6 % (ref 35.0–45.0)
Hemoglobin: 12.3 g/dL (ref 11.7–15.5)
Lymphocytes Relative: 31 %
Lymphs Abs: 1767 cells/uL (ref 850–3900)
MCH: 26.3 pg — ABNORMAL LOW (ref 27.0–33.0)
MCHC: 32.7 g/dL (ref 32.0–36.0)
MCV: 80.3 fL (ref 80.0–100.0)
MPV: 10.7 fL (ref 7.5–12.5)
Monocytes Absolute: 285 cells/uL (ref 200–950)
Monocytes Relative: 5 %
Neutro Abs: 3021 cells/uL (ref 1500–7800)
Neutrophils Relative %: 53 %
Platelets: 201 10*3/uL (ref 140–400)
RBC: 4.68 MIL/uL (ref 3.80–5.10)
RDW: 14.9 % (ref 11.0–15.0)
WBC: 5.7 10*3/uL (ref 3.8–10.8)

## 2016-01-18 ENCOUNTER — Telehealth: Payer: Self-pay | Admitting: Gastroenterology

## 2016-01-18 NOTE — Telephone Encounter (Signed)
PLEASE CALL PT. HER BLOOD COUNT IS NORMAL. SHE HAD ONE SLIGHTLY ELEVATED VALUE THAT IS NOT CLINICALLY SIGNIFICANT AT THIS TIME. IT CAN BE ASSOCIATED WITH H PYLORI INFECTION. SHE NEEDS AN H PYLORI BREATH TEST TO CONFIRM THE H PYLORI INFECTION IS GONE AND SHE SHOULD HAVE A REPEAT CBC W/ DIFF IN 3 MOS.

## 2016-01-19 ENCOUNTER — Ambulatory Visit (INDEPENDENT_AMBULATORY_CARE_PROVIDER_SITE_OTHER): Payer: BLUE CROSS/BLUE SHIELD | Admitting: Nurse Practitioner

## 2016-01-19 ENCOUNTER — Other Ambulatory Visit: Payer: Self-pay

## 2016-01-19 ENCOUNTER — Encounter: Payer: Self-pay | Admitting: Nurse Practitioner

## 2016-01-19 VITALS — BP 106/62 | HR 70 | Temp 97.1°F | Ht 64.0 in | Wt 138.6 lb

## 2016-01-19 DIAGNOSIS — A048 Other specified bacterial intestinal infections: Secondary | ICD-10-CM | POA: Diagnosis not present

## 2016-01-19 DIAGNOSIS — K219 Gastro-esophageal reflux disease without esophagitis: Secondary | ICD-10-CM | POA: Insufficient documentation

## 2016-01-19 DIAGNOSIS — D649 Anemia, unspecified: Secondary | ICD-10-CM

## 2016-01-19 DIAGNOSIS — B9681 Helicobacter pylori [H. pylori] as the cause of diseases classified elsewhere: Secondary | ICD-10-CM | POA: Diagnosis not present

## 2016-01-19 DIAGNOSIS — D509 Iron deficiency anemia, unspecified: Secondary | ICD-10-CM

## 2016-01-19 NOTE — Progress Notes (Signed)
CC'D TO PCP °

## 2016-01-19 NOTE — Assessment & Plan Note (Signed)
Symptoms generally well controlled. She is very averse to taking PPIs. She manages her symptoms with Ginger chews and FDgard. Rare breakthrough symptoms. Continue to monitor.

## 2016-01-19 NOTE — Assessment & Plan Note (Addendum)
History of iron deficiency anemia, previously on iron. Since H. pylori was treated repeat iron studies and CBC normalized. No longer on iron. Upper endoscopy and colonoscopy with no source of bleeding noted. Continue to monitor, recheck CBC 04/20/16.

## 2016-01-19 NOTE — Telephone Encounter (Signed)
Pt saw Walden Field, NP in the office today. She is being scheduled for the H Pylori Breath Test. CBC on file for 04/20/2016.

## 2016-01-19 NOTE — Progress Notes (Signed)
Referring Provider: Smiley Houseman, MD Primary Care Physician:  Smiley Houseman, MD Primary GI:  Dr. Oneida Alar  Chief Complaint  Patient presents with  . Follow-up    HPI:   Leah Cuevas is a 55 y.o. female who presents For postprocedure follow-up. She was last seen in our office 08/20/2015 for dyspepsia and history of adenomatous polyp of colon. At that time she was doing well except for excessive belching and bloating which she attributed to her hiatal hernia. She was offered low-dose proton pump inhibitor which she declined stating she prefers more natural medications. He gave her samples of FSgard to try. She was referred for colonoscopy due to history of adenomatous polyp with last procedure in 2009 and recommended 17 year follow-up. Findings included normal ileum, one colon polyp removed, moderate sized internal hemorrhoids. Biopsy found polyp to be simple adenoma. Recommend recheck CBC/ferritin and consider EGD, next colonoscopy 5-10 years. Noted history of anemia, was scheduled for upper endoscopy which is completed on 10/17/2015 and found no obvious source for iron deficiency anemia, mild nonerosive gastritis. Recommended hold iron, repeat CBC/ferritin April 2017, await biopsy. Labs found H. pylori gastritis and she was treated with Pylera samples. Recommended H. Pylori/hydrogen breath test. Follow-up iron and ferritin were normal. CBC drawn 01/15/2016 noted with normal hemoglobin and improving MCH.  Today she states she still has belching/bloating. Thinks it is likely from her hiatal hernia. Occasionally has bitter/sour taste in her mouth. Still taking ginger chews and FDgard. Didn't take Protonix with Pylera because she "didn't think it's a good idea." Denies abdominal pain, N/V, hematochezia, melena. Denies chest pain, dyspnea, dizziness, lightheadedness, syncope, near syncope. Denies any other upper or lower GI symptoms.  Past Medical History  Diagnosis Date  . Thyroid  disease   . Allergy   . Anemia   . Rotator cuff impingement syndrome   . Esophagitis   . Non-celiac gluten sensitivity   . Hyperplastic polyp of intestine     Past Surgical History  Procedure Laterality Date  . Appendectomy    . Colonoscopy  2006 DB    HYPERPLASTIC POLYP  . Cesarean section  1998  . Tendon reattachment  1999    Right hand  . Dilation and curettage of uterus  1997    s/p spontaneous abortion  . Exploratory laproscopy      Dx PID  . Colonoscopy N/A 09/12/2015    Procedure: COLONOSCOPY;  Surgeon: Danie Binder, MD;  Location: [facility];  Service: Endoscopy;  Laterality: N/A;  1115   . Esophagogastroduodenoscopy N/A 10/17/2015    Procedure: ESOPHAGOGASTRODUODENOSCOPY (EGD);  Surgeon: Danie Binder, MD;  Location: [facility];  Service: Endoscopy;  Laterality: N/A;  830     Current Outpatient Prescriptions  Medication Sig Dispense Refill  . levothyroxine (SYNTHROID, LEVOTHROID) 125 MCG tablet Take 1 tablet (125 mcg total) by mouth daily. 30 tablet 6  . Melatonin 10 MG CAPS Take 1 tablet by mouth daily.    Marland Kitchen bismuth-metronidazole-tetracycline (PYLERA) 140-125-125 MG capsule 3 PO QID FOR 10 DAYS (Patient not taking: Reported on 01/19/2016) 120 capsule 0  . MELATONIN GUMMIES PO Take 2 tablets by mouth at bedtime. Reported on 01/19/2016    . pantoprazole (PROTONIX) 40 MG tablet 1 PO 30 MINUTES PRIOR TO MEALS BID FOR 10 DAYS THEN QD (Patient not taking: Reported on 01/19/2016) 40 tablet 2   No current facility-administered medications for this visit.    Allergies as of 01/19/2016 - Review  Complete 01/19/2016  Allergen Reaction Noted  . Penicillins  04/01/2008    Family History  Problem Relation Age of Onset  . Colon cancer Paternal Uncle   . Coronary artery disease Mother   . Lung cancer Father   . Heart Problems Brother   . Hypertension Brother   . Heart Problems Brother   . Hypertension Brother     Social History   Social History  . Marital Status:  Married    Spouse Name: N/A  . Number of Children: N/A  . Years of Education: N/A   Social History Main Topics  . Smoking status: Never Smoker   . Smokeless tobacco: Never Used  . Alcohol Use: No  . Drug Use: No  . Sexual Activity: Not Currently    Birth Control/ Protection: Post-menopausal   Other Topics Concern  . None   Social History Narrative    Review of Systems: 10-point ROS negative except as per HPI.   Physical Exam: BP 106/62 mmHg  Pulse 70  Temp(Src) 97.1 F (36.2 C)  Ht 5\' 4"  (1.626 m)  Wt 138 lb 9.6 oz (62.869 kg)  BMI 23.78 kg/m2 General:   Alert and oriented. Pleasant and cooperative. Well-nourished and well-developed.  Ears:  Normal auditory acuity. Cardiovascular:  S1, S2 present without murmurs appreciated. Normal pulses noted. Extremities without clubbing or edema. Respiratory:  Clear to auscultation bilaterally. No wheezes, rales, or rhonchi. No distress.  Gastrointestinal:  +BS, soft, and non-distended. Minimal epigastric TTP. No HSM noted. No guarding or rebound. No masses appreciated.  Rectal:  Deferred  Neurologic:  Alert and oriented x4;  grossly normal neurologically. Psych:  Alert and cooperative. Normal mood and affect.    01/19/2016 8:54 AM   Disclaimer: This note was dictated with voice recognition software. Similar sounding words can inadvertently be transcribed and may not be corrected upon review.

## 2016-01-19 NOTE — Assessment & Plan Note (Addendum)
H. pylori positive per biopsy during upper endoscopy. She was treated with Pylera. Today she states she did not take the Protonix as instructed with Pylera because she is averse to taking them. Explained the reasons behind short-term Protonix and the minimal potential adverse effects of a short, ten-day course. She verbalized understanding. Still has some bloating and excessive belching, possibly due to hiatal hernia. We'll recheck hydrogen breath test today for eradication as previously planned.Marland Kitchen Pending results may need to retreat. Return for follow-up as needed or based on HBT and/or CBC results.Marland Kitchen

## 2016-01-19 NOTE — Patient Instructions (Signed)
1. We will help schedule your hydrogen breath test for you. 2. Continue taking ginger juice and FDGard for your acid reflux symptoms. 3. We will recheck your blood work for anemia early September and call you with the results. 4. Return for follow-up as needed, or based on recommendations after we get the results of your hydrogen breath test and/or blood work.

## 2016-01-26 ENCOUNTER — Telehealth: Payer: Self-pay

## 2016-01-26 NOTE — Telephone Encounter (Signed)
REVIEWED-NO ADDITIONAL RECOMMENDATIONS. 

## 2016-01-26 NOTE — Telephone Encounter (Signed)
Melanie from Endo called and states that the HBT machine is down and we will need to reschedule pt once machine is back up and running. Pt is aware that we will call her and reschedule at a later date.  Routing to Fields to make her aware of machine being down.

## 2016-01-27 ENCOUNTER — Ambulatory Visit (HOSPITAL_COMMUNITY)
Admission: RE | Admit: 2016-01-27 | Payer: BLUE CROSS/BLUE SHIELD | Source: Ambulatory Visit | Admitting: Gastroenterology

## 2016-01-27 ENCOUNTER — Encounter (HOSPITAL_COMMUNITY): Admission: RE | Payer: Self-pay | Source: Ambulatory Visit

## 2016-01-27 SURGERY — BREATH TEST, FOR INTESTINAL BACTERIAL OVERGROWTH

## 2016-02-04 ENCOUNTER — Other Ambulatory Visit: Payer: Self-pay

## 2016-02-04 ENCOUNTER — Telehealth: Payer: Self-pay

## 2016-02-04 DIAGNOSIS — A048 Other specified bacterial intestinal infections: Secondary | ICD-10-CM

## 2016-02-04 NOTE — Telephone Encounter (Signed)
Called pt and spoke with her. She has been rescheduled for HBT on 06/27 @ 0700.  Pt is aware

## 2016-02-04 NOTE — Telephone Encounter (Signed)
Pt has old instructions and will use those

## 2016-02-06 DIAGNOSIS — I10 Essential (primary) hypertension: Secondary | ICD-10-CM | POA: Diagnosis not present

## 2016-02-06 DIAGNOSIS — Z7982 Long term (current) use of aspirin: Secondary | ICD-10-CM | POA: Diagnosis not present

## 2016-02-06 DIAGNOSIS — Z1211 Encounter for screening for malignant neoplasm of colon: Secondary | ICD-10-CM | POA: Diagnosis not present

## 2016-02-06 DIAGNOSIS — Z87891 Personal history of nicotine dependence: Secondary | ICD-10-CM | POA: Diagnosis not present

## 2016-02-06 DIAGNOSIS — Q438 Other specified congenital malformations of intestine: Secondary | ICD-10-CM | POA: Diagnosis not present

## 2016-02-06 DIAGNOSIS — E781 Pure hyperglyceridemia: Secondary | ICD-10-CM | POA: Diagnosis not present

## 2016-02-06 DIAGNOSIS — Z79899 Other long term (current) drug therapy: Secondary | ICD-10-CM | POA: Diagnosis not present

## 2016-02-06 DIAGNOSIS — K648 Other hemorrhoids: Secondary | ICD-10-CM | POA: Diagnosis not present

## 2016-02-10 ENCOUNTER — Encounter (HOSPITAL_COMMUNITY): Payer: Self-pay | Admitting: *Deleted

## 2016-02-10 ENCOUNTER — Ambulatory Visit (HOSPITAL_COMMUNITY)
Admission: RE | Admit: 2016-02-10 | Discharge: 2016-02-10 | Disposition: A | Discharge status: Home / Self Care | Payer: BLUE CROSS/BLUE SHIELD | Source: Ambulatory Visit | Attending: Gastroenterology | Admitting: Gastroenterology

## 2016-02-10 ENCOUNTER — Encounter (HOSPITAL_COMMUNITY)
Admission: RE | Disposition: A | Discharge status: Home / Self Care | Payer: Self-pay | Source: Ambulatory Visit | Attending: Gastroenterology

## 2016-02-10 DIAGNOSIS — Z09 Encounter for follow-up examination after completed treatment for conditions other than malignant neoplasm: Secondary | ICD-10-CM | POA: Diagnosis not present

## 2016-02-10 DIAGNOSIS — B9681 Helicobacter pylori [H. pylori] as the cause of diseases classified elsewhere: Secondary | ICD-10-CM | POA: Diagnosis not present

## 2016-02-10 DIAGNOSIS — K2950 Unspecified chronic gastritis without bleeding: Secondary | ICD-10-CM | POA: Insufficient documentation

## 2016-02-10 DIAGNOSIS — K219 Gastro-esophageal reflux disease without esophagitis: Secondary | ICD-10-CM | POA: Diagnosis not present

## 2016-02-10 HISTORY — PX: BACTERIAL OVERGROWTH TEST: SHX5739

## 2016-02-10 SURGERY — BREATH TEST, FOR INTESTINAL BACTERIAL OVERGROWTH

## 2016-02-10 MED ORDER — LACTULOSE 10 GM/15ML PO SOLN
ORAL | Status: AC
  Filled 2016-02-10: qty 60

## 2016-02-10 MED ORDER — SODIUM CHLORIDE 0.9 % IV SOLN
INTRAVENOUS | Status: DC
Start: 2016-02-10 — End: 2016-02-10

## 2016-02-10 MED ORDER — LACTULOSE 10 GM/15ML PO SOLN
25.0000 g | Freq: Once | ORAL | Status: AC
  Administered 2016-02-10: 25 g via ORAL

## 2016-02-10 NOTE — OR Nursing (Signed)
No beans, bran or high fiber cereal the day before the procedure? NO NPO except for water 12 hours before procedure? 1930 yesterday No smoking, sleeping or vigorous exercising for at least 30 before procedure? NO Recent antibiotic use and/or diarrhea?  NO   If yes, physician notified.  Time Baseline 15 mins 30 mins 45 mins 60 mins 75 mins 90 mins 105 mins 120 mins 135 mins 150 mins 165 mins 180 mins  H2-ppm 2 9 10 21 19 19 31 23 31  38 36 48 41

## 2016-02-13 ENCOUNTER — Encounter (HOSPITAL_COMMUNITY): Payer: Self-pay | Admitting: Gastroenterology

## 2016-03-01 ENCOUNTER — Telehealth: Payer: Self-pay | Admitting: Gastroenterology

## 2016-03-01 ENCOUNTER — Encounter: Payer: Self-pay | Admitting: Gastroenterology

## 2016-03-01 MED ORDER — METRONIDAZOLE 500 MG PO TABS: 500.0000 mg | ORAL_TABLET | Freq: Two times a day (BID) | ORAL | Status: DC

## 2016-03-01 MED ORDER — CIPROFLOXACIN HCL 500 MG PO TABS: ORAL_TABLET | ORAL | Status: DC

## 2016-03-01 NOTE — Telephone Encounter (Signed)
PLEASE CALL PT. HER HYDROGEN BREATH TEST SHOWS SMALL BOWEL BACTERIAL OVERGROWTH. SHE SHOULD HAVE BLOATING /BELCHING AFTER TAKING ANTIBIOTICS. CIPRO 500 MF & FLAGYL 500 MG BID for 5 days. MEDICATION SIDE EFFECTS INCLUDE HEEL PAIN, NAUSEA, VOMITING. PATIENT SHOULD AVOID ALCOHOL AND COUGH SYRUP WITH ALCOHOL UNTIL SHE COMPLETES THE ABX.  AFTER SHE COMPLETES THE ABX, SHE SHOULD COMPLETE THE BREATH TEST TO CONFIRM SHE GOT RID OF HER H PYLORI INFECTION. OPV IN 3 MONTH E 30 VISIT H PYLORI GASTRITIS/SIBO.

## 2016-03-01 NOTE — Telephone Encounter (Signed)
OV made and letter mailed °

## 2016-03-01 NOTE — Procedures (Addendum)
  PRE-OPERATIVE DIAGNOSIS:  BLOATING, BELCHING, S/P Rx FOR H PYLORI GASTRITIS  POST-OPERATIVE DIAGNOSIS:  SIBO  PROCEDURE:  Procedure(s): HYDROGEN BREATH TEST  SURGEON:  Surgeon(s): Dorothyann Peng, MD  MEDICATIONS USED:  LACTULOSE  FINDINGS: BREATHALYZER- 0 MIN(2 PPM), FIRST PEAK (21 PPM, 45 MINS), TROUGH (19 PPM 60 MINS), SECOND PEAK (31 PPM, 90 MINS), SECOND TROUGH (23 PPM, 105 MINS)  DIAGNOSIS: SMALL BOWEL BACTERIAL OVERGROWTH.   PLAN OF CARE:  1. CIP/FLAG for 5 days. 2. Follow up in 3 MOS.

## 2016-03-01 NOTE — Telephone Encounter (Signed)
PT is aware. Routing to Ginger to schedule the follow up breath test.

## 2016-03-01 NOTE — Progress Notes (Addendum)
REVIEWED-HBT FOR SIBO TO EVALUATE BELCHING/BLOATING. NEEDS NUC MED BREATH TEST TO CONFIRM ERADICATION OF H PYLORI.

## 2016-03-02 ENCOUNTER — Other Ambulatory Visit: Payer: Self-pay

## 2016-03-02 NOTE — Telephone Encounter (Signed)
Pt started her anti-bx today so we will get her scheduled after she finishes the medications.

## 2016-03-09 ENCOUNTER — Other Ambulatory Visit: Payer: Self-pay

## 2016-03-09 ENCOUNTER — Other Ambulatory Visit: Payer: Self-pay | Admitting: Internal Medicine

## 2016-03-09 DIAGNOSIS — E039 Hypothyroidism, unspecified: Secondary | ICD-10-CM

## 2016-03-09 DIAGNOSIS — E038 Other specified hypothyroidism: Secondary | ICD-10-CM

## 2016-03-15 ENCOUNTER — Telehealth: Payer: Self-pay | Admitting: *Deleted

## 2016-03-15 NOTE — Telephone Encounter (Signed)
Will wait to hear back from patient through her mychart. Kyera Felan,CMA

## 2016-03-15 NOTE — Telephone Encounter (Signed)
Pt calling in to see if you received her message from my chart. Pt would like to get it done at the [facility] lab in [place] or [place]. Please advise. Kenitha Glendinning Kennon Holter, CMA

## 2016-03-20 ENCOUNTER — Telehealth: Payer: Self-pay | Admitting: Internal Medicine

## 2016-03-20 DIAGNOSIS — E039 Hypothyroidism, unspecified: Secondary | ICD-10-CM

## 2016-03-20 NOTE — Telephone Encounter (Signed)
Patient would like to get her TSH level checked at [facility]. I discussed this with Herbie Baltimore who recommends ordering the lab as a future order and print the order requisitions and give them to the patient to take to the lab in [place]. Patient is agreeable to the clinic sending this form via mail.  I have ordered the lab. Please print out the order requisition form and mail to patient. Also let patient know this is mailed. Thank you

## 2016-03-22 NOTE — Telephone Encounter (Signed)
Patient is aware of this. Jazmin Hartsell,CMA  

## 2016-03-30 ENCOUNTER — Other Ambulatory Visit: Payer: Self-pay

## 2016-03-30 ENCOUNTER — Encounter: Payer: Self-pay | Admitting: Internal Medicine

## 2016-03-30 DIAGNOSIS — A048 Other specified bacterial intestinal infections: Secondary | ICD-10-CM

## 2016-03-30 NOTE — Telephone Encounter (Signed)
Pt is set up for HBT on 04/13/16 @ 7:00. Instructions are in the mail and she is aware

## 2016-04-06 ENCOUNTER — Other Ambulatory Visit: Payer: Self-pay | Admitting: Internal Medicine

## 2016-04-06 DIAGNOSIS — E039 Hypothyroidism, unspecified: Secondary | ICD-10-CM | POA: Diagnosis not present

## 2016-04-07 LAB — TSH: TSH: 1.24 mIU/L

## 2016-04-12 ENCOUNTER — Other Ambulatory Visit: Payer: Self-pay

## 2016-04-12 DIAGNOSIS — K219 Gastro-esophageal reflux disease without esophagitis: Secondary | ICD-10-CM

## 2016-04-12 DIAGNOSIS — D509 Iron deficiency anemia, unspecified: Secondary | ICD-10-CM

## 2016-04-12 DIAGNOSIS — A048 Other specified bacterial intestinal infections: Secondary | ICD-10-CM

## 2016-04-13 ENCOUNTER — Encounter (HOSPITAL_COMMUNITY)
Admission: RE | Disposition: A | Discharge status: Home / Self Care | Payer: Self-pay | Source: Ambulatory Visit | Attending: Gastroenterology

## 2016-04-13 ENCOUNTER — Encounter: Payer: Self-pay | Admitting: Internal Medicine

## 2016-04-13 ENCOUNTER — Ambulatory Visit (HOSPITAL_COMMUNITY)
Admission: RE | Admit: 2016-04-13 | Discharge: 2016-04-13 | Disposition: A | Discharge status: Home / Self Care | Payer: BLUE CROSS/BLUE SHIELD | Source: Ambulatory Visit | Attending: Gastroenterology | Admitting: Gastroenterology

## 2016-04-13 DIAGNOSIS — R140 Abdominal distension (gaseous): Secondary | ICD-10-CM | POA: Insufficient documentation

## 2016-04-13 HISTORY — PX: BACTERIAL OVERGROWTH TEST: SHX5739

## 2016-04-13 SURGERY — BREATH TEST, FOR INTESTINAL BACTERIAL OVERGROWTH

## 2016-04-13 MED ORDER — LACTULOSE 10 GM/15ML PO SOLN
ORAL | Status: AC
  Administered 2016-04-13: 27.5 g
  Filled 2016-04-13: qty 60

## 2016-04-13 NOTE — OR Nursing (Signed)
No beans, bran or high fiber cereal the day before the procedure? no NPO except for water 12 hours before procedure? yes No smoking, sleeping or vigorous exercising for at least 30 before procedure? no Recent antibiotic use and/or diarrhea? no   If yes, physician notified.  Time Baseline 15 mins 30 mins 45 mins 60 mins 75 mins 90 mins 105 mins 120 mins 135 mins 150 mins 165 mins 180 mins  H2-ppm 1 2 1 2 3 5 7 7 5 4 3 3 2   Patient notified office she ate lentils Sunday about 6:30.  Antibiotic last dose 03/06/2016. Patient completed procedure without incidence.

## 2016-04-13 NOTE — Telephone Encounter (Signed)
Needs refill on levothyroxine.  [facility] in [place]

## 2016-04-14 ENCOUNTER — Other Ambulatory Visit: Payer: Self-pay | Admitting: Internal Medicine

## 2016-04-14 DIAGNOSIS — E038 Other specified hypothyroidism: Secondary | ICD-10-CM

## 2016-04-14 MED ORDER — LEVOTHYROXINE SODIUM 125 MCG PO TABS: 125.0000 ug | ORAL_TABLET | Freq: Every day | ORAL | 2 refills | Status: DC

## 2016-04-14 NOTE — Progress Notes (Signed)
Refill sent per patient request.

## 2016-04-15 ENCOUNTER — Encounter (HOSPITAL_COMMUNITY): Payer: Self-pay | Admitting: Gastroenterology

## 2016-04-16 DIAGNOSIS — B9681 Helicobacter pylori [H. pylori] as the cause of diseases classified elsewhere: Secondary | ICD-10-CM | POA: Diagnosis not present

## 2016-04-16 DIAGNOSIS — D509 Iron deficiency anemia, unspecified: Secondary | ICD-10-CM | POA: Diagnosis not present

## 2016-04-16 DIAGNOSIS — K219 Gastro-esophageal reflux disease without esophagitis: Secondary | ICD-10-CM | POA: Diagnosis not present

## 2016-04-16 LAB — CBC WITH DIFFERENTIAL/PLATELET
Basophils Absolute: 68 cells/uL (ref 0–200)
Basophils Relative: 1 %
Eosinophils Absolute: 1088 cells/uL — ABNORMAL HIGH (ref 15–500)
Eosinophils Relative: 16 %
HCT: 39.7 % (ref 35.0–45.0)
Hemoglobin: 13.1 g/dL (ref 11.7–15.5)
Lymphocytes Relative: 28 %
Lymphs Abs: 1904 cells/uL (ref 850–3900)
MCH: 25.6 pg — ABNORMAL LOW (ref 27.0–33.0)
MCHC: 33 g/dL (ref 32.0–36.0)
MCV: 77.7 fL — ABNORMAL LOW (ref 80.0–100.0)
MPV: 10.7 fL (ref 7.5–12.5)
Monocytes Absolute: 408 cells/uL (ref 200–950)
Monocytes Relative: 6 %
Neutro Abs: 3332 cells/uL (ref 1500–7800)
Neutrophils Relative %: 49 %
Platelets: 201 10*3/uL (ref 140–400)
RBC: 5.11 MIL/uL — ABNORMAL HIGH (ref 3.80–5.10)
RDW: 14.1 % (ref 11.0–15.0)
WBC: 6.8 10*3/uL (ref 3.8–10.8)

## 2016-04-20 NOTE — Progress Notes (Signed)
Pt is aware.  

## 2016-04-21 ENCOUNTER — Other Ambulatory Visit: Payer: Self-pay

## 2016-04-21 DIAGNOSIS — R140 Abdominal distension (gaseous): Secondary | ICD-10-CM

## 2016-04-26 ENCOUNTER — Telehealth: Payer: Self-pay

## 2016-04-26 ENCOUNTER — Other Ambulatory Visit: Payer: Self-pay

## 2016-04-26 DIAGNOSIS — R140 Abdominal distension (gaseous): Secondary | ICD-10-CM

## 2016-04-26 NOTE — Telephone Encounter (Signed)
Pt called and said she could not do the H Pylori Breath Test when she went to the lab.  I checked and the lab was entered and had not been released.  She will go Friday.

## 2016-04-30 DIAGNOSIS — R140 Abdominal distension (gaseous): Secondary | ICD-10-CM | POA: Diagnosis not present

## 2016-05-03 LAB — H. PYLORI BREATH TEST: H. pylori Breath Test: NOT DETECTED

## 2016-05-03 NOTE — Procedures (Signed)
  PRE-OPERATIVE DIAGNOSIS:  BLOATING  POST-OPERATIVE DIAGNOSIS:  NORMAL STUDY. SIBO RESPONDED TO CIP/FLAGYL  PROCEDURE:  Procedure(s): HYDROGEN BREATH TEST  SURGEON:  Surgeon(s): Dorothyann Peng, MD  MEDICATIONS USED:  LACTULOSE  FINDINGS: BREATHALYZER- 0 MIN(1 PPM), FIRST PEAK (7 PPM, 90 MINS), TROUGH (2 PPM 180 MINS)  DIAGNOSIS: NORMAL STUDY  PLAN OF CARE:  1. CONTINUE GINGER CHEWS AND FD GARD. 2. Follow up in FEB 2018.

## 2016-05-05 NOTE — Telephone Encounter (Signed)
RESULTS REVIEWED IN MY CHART SEP 18.

## 2016-06-01 ENCOUNTER — Encounter: Payer: Self-pay | Admitting: Nurse Practitioner

## 2016-06-01 ENCOUNTER — Ambulatory Visit (INDEPENDENT_AMBULATORY_CARE_PROVIDER_SITE_OTHER): Payer: BLUE CROSS/BLUE SHIELD | Admitting: Nurse Practitioner

## 2016-06-01 VITALS — BP 111/69 | HR 58 | Temp 98.3°F | Ht 64.0 in | Wt 145.0 lb

## 2016-06-01 DIAGNOSIS — K219 Gastro-esophageal reflux disease without esophagitis: Secondary | ICD-10-CM

## 2016-06-01 DIAGNOSIS — K6389 Other specified diseases of intestine: Secondary | ICD-10-CM | POA: Insufficient documentation

## 2016-06-01 DIAGNOSIS — A048 Other specified bacterial intestinal infections: Secondary | ICD-10-CM | POA: Diagnosis not present

## 2016-06-01 NOTE — Assessment & Plan Note (Signed)
Urea breath test positive for SIBO. Treated with antibiotics and symptoms including belching and bloating have resolved. Continue to monitor, contact us if any recurrent symptoms.

## 2016-06-01 NOTE — Assessment & Plan Note (Signed)
Symptoms are well managed to her satisfaction with apple cider vinegar. She prefers more homeopathic treatments. No PPI at this time. Return for follow-up as needed, notify us if symptoms worsen.

## 2016-06-01 NOTE — Progress Notes (Signed)
Referring Provider: Smiley Houseman, MD Primary Care Physician:  Smiley Houseman, MD Primary GI:  Dr. Oneida Alar  Chief Complaint  Patient presents with  . Follow-up    HPI:   Leah Cuevas is a 55 y.o. female who presents for follow-up on postprocedure as well as SIBO testing. The patient was last seen in our office 01/19/2016 for follow-up. At that time she was still having bulging/bloating and thinks it is likely from her hiatal hernia, occasional GERD symptoms, uses ginger chews and FDgard. She did not take Protonix with Pylera because "I don't think is a good idea." No other symptoms at that time. She did have a recent positive H. pylori biopsy during endoscopy. Recommended recheck hydrogen breath test for eradication as well as urea breath test for spontaneous intestinal bacterial overgrowth.   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 Recommended to follow through with hydrogen breath test for H. pylori eradication when antibiotics completed. This is completed 04/30/2016 and found none detected.  Today she states she's doing well overall. Finished all antibiotics. GERD symptoms have resolved. Drinking apple cider vinegar which she thinks helps a lot. Bloating and belching much improved. Denies abdominal pain, N/V, hematochezia, melena. Denies chest pain, dyspnea, dizziness, lightheadedness, syncope, near syncope. Denies any other upper or lower GI symptoms.  Past Medical History:  Diagnosis Date  . Allergy   . Anemia   . Esophagitis   . Hyperplastic polyp of intestine   . Non-celiac gluten sensitivity   . Rotator cuff impingement syndrome   . Thyroid disease     Past Surgical History:  Procedure Laterality Date  . APPENDECTOMY    . BACTERIAL OVERGROWTH TEST N/A 02/10/2016   Procedure: BACTERIAL OVERGROWTH TEST;  Surgeon: Danie Binder, MD;  [facility]: [facility];  Service: Endoscopy;   Laterality: N/A;  0700  . BACTERIAL OVERGROWTH TEST N/A 04/13/2016   Procedure: BACTERIAL OVERGROWTH TEST;  Surgeon: Danie Binder, MD;  [facility]: [facility];  Service: Endoscopy;  Laterality: N/A;  0700  . CESAREAN SECTION  1998  . COLONOSCOPY  2006 DB   HYPERPLASTIC POLYP  . COLONOSCOPY N/A 09/12/2015   Procedure: COLONOSCOPY;  Surgeon: Danie Binder, MD;  [facility]: [facility];  Service: Endoscopy;  Laterality: N/A;  1115   . DILATION AND CURETTAGE OF UTERUS  1997   s/p spontaneous abortion  . ESOPHAGOGASTRODUODENOSCOPY N/A 10/17/2015   Procedure: ESOPHAGOGASTRODUODENOSCOPY (EGD);  Surgeon: Danie Binder, MD;  Location: [facility];  Service: Endoscopy;  Laterality: N/A;  830   . EXPLORATORY LAPROSCOPY     Dx PID  . Tendon reattachment  1999   Right hand    Current Outpatient Prescriptions  Medication Sig Dispense Refill  . APPLE CIDER VINEGAR PO Take 30 mLs by mouth 2 (two) times daily.    Marland Kitchen levothyroxine (SYNTHROID, LEVOTHROID) 125 MCG tablet Take 1 tablet (125 mcg total) by mouth daily. 30 tablet 2  . Melatonin 10 MG CAPS Take 1 tablet by mouth daily.     No current facility-administered medications for this visit.     Allergies as of 06/01/2016 - Review Complete 06/01/2016  Allergen Reaction Noted  . Penicillins  04/01/2008    Family History  Problem Relation Age of Onset  . Coronary artery disease Mother   . Heart Problems Brother   . Hypertension Brother   . Heart Problems Brother   .  Hypertension Brother   . Colon cancer Paternal Uncle   . Lung cancer Father     Social History   Social History  . Marital status: Married    Spouse name: N/A  . Number of children: N/A  . Years of education: N/A   Social History Main Topics  . Smoking status: Never Smoker  . Smokeless tobacco: Never Used  . Alcohol use No  . Drug use: No  . Sexual activity: Not Currently    Birth control/ protection: Post-menopausal   Other Topics Concern  . None   Social  History Narrative  . None    Review of Systems: Complete ROS negative except as per HPI.   Physical Exam: BP 111/69   Pulse (!) 58   Temp 98.3 F (36.8 C) (Oral)   Ht 5\' 4"  (1.626 m)   Wt 145 lb (65.8 kg)   BMI 24.89 kg/m  General:   Alert and oriented. Pleasant and cooperative. Well-nourished and well-developed.  Ears:  Normal auditory acuity. Cardiovascular:  S1, S2 present without murmurs appreciated. Extremities without clubbing or edema. Respiratory:  Clear to auscultation bilaterally. No wheezes, rales, or rhonchi. No distress.  Gastrointestinal:  +BS, soft, non-tender and non-distended. No HSM noted. No guarding or rebound. No masses appreciated.  Rectal:  Deferred  Musculoskalatal:  Symmetrical without gross deformities. Neurologic:  Alert and oriented x4;  grossly normal neurologically. Psych:  Alert and cooperative. Normal mood and affect. Heme/Lymph/Immune: No excessive bruising noted.    06/01/2016 8:34 AM   Disclaimer: This note was dictated with voice recognition software. Similar sounding words can inadvertently be transcribed and may not be corrected upon review.

## 2016-06-01 NOTE — Patient Instructions (Signed)
1. Continue your current treatments. 2. Notify us if your symptoms return or worsen. 3. Return for follow-up as needed.

## 2016-06-01 NOTE — Assessment & Plan Note (Signed)
Previously was positive for H. pylori on endoscopic biopsy. She did not follow her treatment instructions and there was concern for possible lingering infection. Hydrogen breath test did not detect any H. pylori. At this point she can be considered eradicated. She is to notify us of any worsening symptoms, return for follow-up as needed.

## 2016-06-02 DIAGNOSIS — M50322 Other cervical disc degeneration at C5-C6 level: Secondary | ICD-10-CM | POA: Diagnosis not present

## 2016-06-02 DIAGNOSIS — M542 Cervicalgia: Secondary | ICD-10-CM | POA: Diagnosis not present

## 2016-06-02 DIAGNOSIS — M50321 Other cervical disc degeneration at C4-C5 level: Secondary | ICD-10-CM | POA: Diagnosis not present

## 2016-06-02 NOTE — Progress Notes (Signed)
CC'D TO PCP °

## 2016-07-16 ENCOUNTER — Other Ambulatory Visit: Payer: Self-pay | Admitting: Internal Medicine

## 2016-07-16 DIAGNOSIS — E038 Other specified hypothyroidism: Secondary | ICD-10-CM

## 2016-10-18 ENCOUNTER — Other Ambulatory Visit: Payer: Self-pay | Admitting: Internal Medicine

## 2016-10-18 DIAGNOSIS — E038 Other specified hypothyroidism: Secondary | ICD-10-CM

## 2016-10-19 ENCOUNTER — Encounter: Payer: Self-pay | Admitting: *Deleted

## 2016-10-19 ENCOUNTER — Telehealth: Payer: Self-pay | Admitting: Internal Medicine

## 2016-10-19 DIAGNOSIS — E039 Hypothyroidism, unspecified: Secondary | ICD-10-CM

## 2016-10-19 NOTE — Telephone Encounter (Signed)
LM for patient on identified VM asking her to call back and schedule a lab appointment.  There is a future order from 03/2016 that can be used for that visit. Eisa Conaway,CMA

## 2016-10-19 NOTE — Telephone Encounter (Signed)
Will forward to MD to place a new future order for [facility] and then I can print it off and fax it to them.  Genora Arp,CMA

## 2016-10-19 NOTE — Telephone Encounter (Signed)
Please let Leah Cuevas know that I would like to check her TSH to make sure it is stable.

## 2016-10-19 NOTE — Telephone Encounter (Signed)
Pt would like to have her labs done in [place] by either [facility]. She would like to have the dr have the orders sent to either lab. Please let pt know which lab the orders are sent to.

## 2016-10-21 NOTE — Telephone Encounter (Signed)
TSH is ordered as a future order. Please print and fax to lab. Thank you!

## 2016-10-22 NOTE — Telephone Encounter (Signed)
Lab req printed and faxed to [facility] in [place], [place] [phone]) I called pt to inform- no answer, detailed VM left informing her [facility] has been faxed her lab req and she should be able to have her labs drawn. Pt to call us back if any issues arise.

## 2016-10-26 ENCOUNTER — Encounter (HOSPITAL_COMMUNITY): Payer: Self-pay | Admitting: Cardiology

## 2016-10-26 ENCOUNTER — Emergency Department (HOSPITAL_COMMUNITY)
Admission: EM | Admit: 2016-10-26 | Discharge: 2016-10-26 | Disposition: A | Discharge status: Home / Self Care | Payer: BLUE CROSS/BLUE SHIELD | Attending: Emergency Medicine | Admitting: Emergency Medicine

## 2016-10-26 ENCOUNTER — Emergency Department (HOSPITAL_COMMUNITY): Payer: BLUE CROSS/BLUE SHIELD

## 2016-10-26 DIAGNOSIS — Y939 Activity, unspecified: Secondary | ICD-10-CM | POA: Diagnosis not present

## 2016-10-26 DIAGNOSIS — W010XXA Fall on same level from slipping, tripping and stumbling without subsequent striking against object, initial encounter: Secondary | ICD-10-CM | POA: Diagnosis not present

## 2016-10-26 DIAGNOSIS — E039 Hypothyroidism, unspecified: Secondary | ICD-10-CM | POA: Diagnosis not present

## 2016-10-26 DIAGNOSIS — S63251A Unspecified dislocation of left index finger, initial encounter: Secondary | ICD-10-CM | POA: Diagnosis not present

## 2016-10-26 DIAGNOSIS — S63283A Dislocation of proximal interphalangeal joint of left middle finger, initial encounter: Secondary | ICD-10-CM | POA: Diagnosis not present

## 2016-10-26 DIAGNOSIS — Y999 Unspecified external cause status: Secondary | ICD-10-CM | POA: Insufficient documentation

## 2016-10-26 DIAGNOSIS — Y929 Unspecified place or not applicable: Secondary | ICD-10-CM | POA: Diagnosis not present

## 2016-10-26 DIAGNOSIS — S63253A Unspecified dislocation of left middle finger, initial encounter: Secondary | ICD-10-CM | POA: Diagnosis not present

## 2016-10-26 DIAGNOSIS — S6992XA Unspecified injury of left wrist, hand and finger(s), initial encounter: Secondary | ICD-10-CM | POA: Diagnosis present

## 2016-10-26 DIAGNOSIS — S63259A Unspecified dislocation of unspecified finger, initial encounter: Secondary | ICD-10-CM

## 2016-10-26 MED ORDER — LIDOCAINE HCL (PF) 1 % IJ SOLN
5.0000 mL | Freq: Once | INTRAMUSCULAR | Status: AC
  Administered 2016-10-26: 5 mL
  Filled 2016-10-26: qty 5

## 2016-10-26 NOTE — ED Triage Notes (Signed)
Fall on ice this morning. Pain and swelling left hand middle finger

## 2016-10-26 NOTE — ED Provider Notes (Signed)
[facility] DEPT Provider Note   CSN: [id number] Arrival date & time: 10/26/16  4098     History   Chief Complaint Chief Complaint  Patient presents with  . Fall    HPI Leah KOENEMAN is a 56 y.o. female.  HPI patient slipped on ice this morning fell landing on her left hand. Complaining of pain in her left middle finger. No other injury. Cannot flex or extend the finger. Skin is intact. No head neck or back pain.  Past Medical History:  Diagnosis Date  . Allergy   . Anemia   . Esophagitis   . Hyperplastic polyp of intestine   . Non-celiac gluten sensitivity   . Rotator cuff impingement syndrome   . Thyroid disease     Patient Active Problem List   Diagnosis Date Noted  . Small intestinal bacterial overgrowth 06/01/2016  . H. pylori infection 01/19/2016  . GERD (gastroesophageal reflux disease) 01/19/2016  . Dyspepsia 08/20/2015  . History of adenomatous polyp of colon 08/20/2015  . Anemia, iron deficiency 06/26/2015  . Bilateral dry eyes 06/26/2015  . Pinguecula of both eyes 06/26/2015  . Rash and nonspecific skin eruption 05/21/2015  . Actinic keratosis of left cheek 05/21/2015  . Ankle edema 02/06/2015  . Dyspareunia 02/05/2015  . Foot pain, left 07/08/2014  . Unspecified visual disturbance 01/30/2014  . New onset tinnitus of right ear 10/29/2013  . Hyperplastic polyp of intestine 12/09/2011  . Vitamin D deficiency 12/09/2011  . Non-celiac gluten sensitivity 12/08/2010  . Hypothyroidism 10/22/2010  . ANEMIA, HX OF 04/01/2008  . ESOPHAGITIS, UNSPECIFIED 10/13/2006    Past Surgical History:  Procedure Laterality Date  . APPENDECTOMY    . BACTERIAL OVERGROWTH TEST N/A 02/10/2016   Procedure: BACTERIAL OVERGROWTH TEST;  Surgeon: Danie Binder, MD;  [facility];  Service: Endoscopy;  Laterality: N/A;  0700  . BACTERIAL OVERGROWTH TEST N/A 04/13/2016   Procedure: BACTERIAL OVERGROWTH TEST;  Surgeon: Danie Binder, MD;  Location: [facility];  Service: Endoscopy;  Laterality: N/A;  0700  . CESAREAN SECTION  1998  . COLONOSCOPY  2006 DB   HYPERPLASTIC POLYP  . COLONOSCOPY N/A 09/12/2015   Procedure: COLONOSCOPY;  Surgeon: Danie Binder, MD;  Location: [facility];  Service: Endoscopy;  Laterality: N/A;  1115   . DILATION AND CURETTAGE OF UTERUS  1997   s/p spontaneous abortion  . ESOPHAGOGASTRODUODENOSCOPY N/A 10/17/2015   Procedure: ESOPHAGOGASTRODUODENOSCOPY (EGD);  Surgeon: Danie Binder, MD;  Location[facility];  Service: Endoscopy;  Laterality: N/A;  830   . EXPLORATORY LAPROSCOPY     Dx PID  . Tendon reattachment  1999   Right hand    OB History    No data available       Home Medications    Prior to Admission medications   Medication Sig Start Date End Date Taking? Authorizing Provider  APPLE CIDER VINEGAR PO Take 30 mLs by mouth 2 (two) times daily.    Historical Provider, MD  levothyroxine (SYNTHROID, LEVOTHROID) 125 MCG tablet TAKE ONE TABLET BY MOUTH ONCE DAILY 10/19/16   Smiley Houseman, MD  Melatonin 10 MG CAPS Take 1 tablet by mouth daily.    Historical Provider, MD    Family History Family History  Problem Relation Age of Onset  . Coronary artery disease Mother   . Heart Problems Brother   . Hypertension Brother   . Heart Problems Brother   . Hypertension Brother   .  Colon cancer Paternal Uncle   . Lung cancer Father     Social History Social History  Substance Use Topics  . Smoking status: Never Smoker  . Smokeless tobacco: Never Used  . Alcohol use No     Allergies   Penicillins   Review of Systems Review of Systems  Constitutional: Negative for appetite change.  Respiratory: Negative for shortness of breath.   Cardiovascular: Negative for chest pain.  Gastrointestinal: Negative for abdominal pain.  Genitourinary: Negative for flank pain.  Musculoskeletal:       Left middle finger pain  Neurological: Negative for weakness and numbness.    Psychiatric/Behavioral: Negative for confusion.     Physical Exam Updated Vital Signs BP 144/99 (BP Location: Right Arm)   Pulse 76   Temp 98 F (36.7 C) (Oral)   Resp 16   SpO2 100%   Physical Exam  Constitutional: She appears well-developed and well-nourished.  HENT:  Head: Atraumatic.  Neck: Neck supple.  Musculoskeletal: She exhibits tenderness.  Tenderness and swelling to left middle finger. There is swelling over the PIP joint. Distal finger is somewhat rotated. Patient cannot flex and extend the finger at the PIP joint and the DIP joint causes pain at the PIP joint with movement.  Skin: Skin is warm. Capillary refill takes less than 2 seconds.     ED Treatments / Results  Labs (all labs ordered are listed, but only abnormal results are displayed) Labs Reviewed - No data to display  EKG  EKG Interpretation None       Radiology Dg Finger Middle Left  Result Date: 10/26/2016 CLINICAL DATA:  Post reduction following fall EXAM: LEFT THIRD FINGER 2+V COMPARISON:  Study obtained earlier in the day FINDINGS: Frontal, oblique, lateral views were obtained. The recent PIP joint dislocation has been reduced successfully. Currently no fracture or dislocation is evident. The joint spaces appear normal. No erosive change. IMPRESSION: Successful reduction of PIP joint dislocation. Currently no fracture or dislocation. No apparent arthropathy. Electronically Signed   By: Lowella Grip III M.D.   On: 10/26/2016 08:29   Dg Finger Middle Left  Result Date: 10/26/2016 CLINICAL DATA:  Fall this morning.  Middle finger pain. EXAM: LEFT MIDDLE FINGER 2+V COMPARISON:  None. FINDINGS: There is posterior dislocation at the left middle finger PIP joint. The middle phalanx is dislocated posteriorly relative to the proximal phalanx. No visible fracture. IMPRESSION: Posterior dislocation at the left middle finger PIP joint. Electronically Signed   By: Rolm Baptise M.D.   On: 10/26/2016 07:31     Procedures ORTHOPEDIC INJURY TREATMENT Date/Time: 10/26/2016 8:20 AM Performed by: Davonna Belling Authorized by: Davonna Belling  Consent: Verbal consent obtained. Written consent not obtained. Risks and benefits: risks, benefits and alternatives were discussed Consent given by: patient Patient understanding: patient states understanding of the procedure being performed Imaging studies: imaging studies available Required items: required blood products, implants, devices, and special equipment available Patient identity confirmed: verbally with patient Time out: Immediately prior to procedure a "time out" was called to verify the correct patient, procedure, equipment, support staff and site/side marked as required. Injury location: finger Location details: left long finger Injury type: dislocation Pre-procedure neurovascular assessment: neurovascularly intact Pre-procedure distal perfusion: normal Pre-procedure neurological function: normal Pre-procedure range of motion: reduced Anesthesia: digital block  Anesthesia: Local anesthesia used: yes Local Anesthetic: lidocaine 1% without epinephrine Anesthetic total: 3 mL  Sedation: Patient sedated: no Manipulation performed: yes Reduction successful: yes X-ray confirmed reduction: yes Immobilization: splint  Post-procedure neurovascular assessment: post-procedure neurovascularly intact Post-procedure distal perfusion: normal Post-procedure neurological function: normal Post-procedure range of motion: improved Patient tolerance: Patient tolerated the procedure well with no immediate complications    (including critical care time)  Medications Ordered in ED Medications  lidocaine (PF) (XYLOCAINE) 1 % injection 5 mL (5 mLs Other Given by Other 10/26/16 0755)     Initial Impression / Assessment and Plan / ED Course  I have reviewed the triage vital signs and the nursing notes.  Pertinent labs & imaging results that  were available during my care of the patient were reviewed by me and considered in my medical decision making (see chart for details).     Patient with fall and finger injury. Has PIP dislocation. Reduced with digital block. X-ray postreduction confirms reduction. Will follow-up with hand surgery since patient does not want to follow-up with Dr. Aline Brochure.  Final Clinical Impressions(s) / ED Diagnoses   Final diagnoses:  Dislocation of finger, initial encounter    New Prescriptions New Prescriptions   No medications on file     Davonna Belling, MD 10/26/16 0840

## 2016-10-29 DIAGNOSIS — S63273A Dislocation of unspecified interphalangeal joint of left middle finger, initial encounter: Secondary | ICD-10-CM | POA: Diagnosis not present

## 2016-10-29 DIAGNOSIS — S63273D Dislocation of unspecified interphalangeal joint of left middle finger, subsequent encounter: Secondary | ICD-10-CM | POA: Diagnosis not present

## 2016-12-24 ENCOUNTER — Telehealth: Payer: Self-pay

## 2016-12-24 ENCOUNTER — Other Ambulatory Visit: Payer: Self-pay | Admitting: Family Medicine

## 2016-12-24 DIAGNOSIS — E039 Hypothyroidism, unspecified: Secondary | ICD-10-CM | POA: Diagnosis not present

## 2016-12-25 LAB — TSH: TSH: 0.668 u[IU]/mL (ref 0.450–4.500)

## 2017-01-14 NOTE — Telephone Encounter (Signed)
error 

## 2017-01-24 ENCOUNTER — Other Ambulatory Visit: Payer: Self-pay | Admitting: Internal Medicine

## 2017-01-24 DIAGNOSIS — E038 Other specified hypothyroidism: Secondary | ICD-10-CM

## 2017-01-25 ENCOUNTER — Encounter: Payer: Self-pay | Admitting: Internal Medicine

## 2017-05-04 ENCOUNTER — Other Ambulatory Visit: Payer: Self-pay | Admitting: Student

## 2017-05-04 DIAGNOSIS — E038 Other specified hypothyroidism: Secondary | ICD-10-CM

## 2017-05-05 DIAGNOSIS — N898 Other specified noninflammatory disorders of vagina: Secondary | ICD-10-CM | POA: Diagnosis not present

## 2017-05-05 DIAGNOSIS — N9419 Other specified dyspareunia: Secondary | ICD-10-CM | POA: Diagnosis not present

## 2017-05-05 DIAGNOSIS — Z0000 Encounter for general adult medical examination without abnormal findings: Secondary | ICD-10-CM | POA: Diagnosis not present

## 2017-05-05 DIAGNOSIS — R5383 Other fatigue: Secondary | ICD-10-CM | POA: Diagnosis not present

## 2017-05-05 DIAGNOSIS — E039 Hypothyroidism, unspecified: Secondary | ICD-10-CM | POA: Diagnosis not present

## 2017-05-05 DIAGNOSIS — R6882 Decreased libido: Secondary | ICD-10-CM | POA: Diagnosis not present

## 2017-05-05 DIAGNOSIS — Z0389 Encounter for observation for other suspected diseases and conditions ruled out: Secondary | ICD-10-CM | POA: Diagnosis not present

## 2017-05-05 DIAGNOSIS — E559 Vitamin D deficiency, unspecified: Secondary | ICD-10-CM | POA: Diagnosis not present

## 2017-05-19 DIAGNOSIS — R6882 Decreased libido: Secondary | ICD-10-CM | POA: Diagnosis not present

## 2017-05-19 DIAGNOSIS — H43393 Other vitreous opacities, bilateral: Secondary | ICD-10-CM | POA: Diagnosis not present

## 2017-05-19 DIAGNOSIS — G441 Vascular headache, not elsewhere classified: Secondary | ICD-10-CM | POA: Diagnosis not present

## 2017-06-07 DIAGNOSIS — R5383 Other fatigue: Secondary | ICD-10-CM | POA: Diagnosis not present

## 2017-06-07 DIAGNOSIS — E039 Hypothyroidism, unspecified: Secondary | ICD-10-CM | POA: Diagnosis not present

## 2017-06-07 DIAGNOSIS — R6882 Decreased libido: Secondary | ICD-10-CM | POA: Diagnosis not present

## 2017-06-07 DIAGNOSIS — E559 Vitamin D deficiency, unspecified: Secondary | ICD-10-CM | POA: Diagnosis not present

## 2017-06-08 ENCOUNTER — Other Ambulatory Visit (HOSPITAL_COMMUNITY): Payer: Self-pay | Admitting: Internal Medicine

## 2017-06-08 DIAGNOSIS — Z780 Asymptomatic menopausal state: Secondary | ICD-10-CM

## 2017-06-20 ENCOUNTER — Ambulatory Visit (HOSPITAL_COMMUNITY)
Admission: RE | Admit: 2017-06-20 | Discharge: 2017-06-20 | Disposition: A | Discharge status: Home / Self Care | Payer: BLUE CROSS/BLUE SHIELD | Source: Ambulatory Visit | Attending: Internal Medicine | Admitting: Internal Medicine

## 2017-06-20 DIAGNOSIS — N951 Menopausal and female climacteric states: Secondary | ICD-10-CM | POA: Diagnosis not present

## 2017-06-20 DIAGNOSIS — Z780 Asymptomatic menopausal state: Secondary | ICD-10-CM

## 2017-06-20 DIAGNOSIS — M85852 Other specified disorders of bone density and structure, left thigh: Secondary | ICD-10-CM | POA: Diagnosis not present

## 2017-07-13 DIAGNOSIS — E2839 Other primary ovarian failure: Secondary | ICD-10-CM | POA: Diagnosis not present

## 2017-07-13 DIAGNOSIS — N898 Other specified noninflammatory disorders of vagina: Secondary | ICD-10-CM | POA: Diagnosis not present

## 2017-07-13 DIAGNOSIS — R6882 Decreased libido: Secondary | ICD-10-CM | POA: Diagnosis not present

## 2017-09-08 DIAGNOSIS — R6882 Decreased libido: Secondary | ICD-10-CM | POA: Diagnosis not present

## 2017-09-08 DIAGNOSIS — E039 Hypothyroidism, unspecified: Secondary | ICD-10-CM | POA: Diagnosis not present

## 2017-09-08 DIAGNOSIS — R5383 Other fatigue: Secondary | ICD-10-CM | POA: Diagnosis not present

## 2017-10-19 DIAGNOSIS — N9419 Other specified dyspareunia: Secondary | ICD-10-CM | POA: Diagnosis not present

## 2017-10-19 DIAGNOSIS — E2839 Other primary ovarian failure: Secondary | ICD-10-CM | POA: Diagnosis not present

## 2017-10-19 DIAGNOSIS — R5383 Other fatigue: Secondary | ICD-10-CM | POA: Diagnosis not present

## 2017-10-19 DIAGNOSIS — R6882 Decreased libido: Secondary | ICD-10-CM | POA: Diagnosis not present

## 2017-10-19 DIAGNOSIS — E039 Hypothyroidism, unspecified: Secondary | ICD-10-CM | POA: Diagnosis not present

## 2017-12-27 DIAGNOSIS — M941 Relapsing polychondritis: Secondary | ICD-10-CM | POA: Diagnosis not present

## 2017-12-27 DIAGNOSIS — E2839 Other primary ovarian failure: Secondary | ICD-10-CM | POA: Diagnosis not present

## 2017-12-27 DIAGNOSIS — E039 Hypothyroidism, unspecified: Secondary | ICD-10-CM | POA: Diagnosis not present

## 2018-02-28 DIAGNOSIS — E2839 Other primary ovarian failure: Secondary | ICD-10-CM | POA: Diagnosis not present

## 2018-02-28 DIAGNOSIS — N9419 Other specified dyspareunia: Secondary | ICD-10-CM | POA: Diagnosis not present

## 2018-02-28 DIAGNOSIS — E039 Hypothyroidism, unspecified: Secondary | ICD-10-CM | POA: Diagnosis not present

## 2018-02-28 DIAGNOSIS — R6882 Decreased libido: Secondary | ICD-10-CM | POA: Diagnosis not present

## 2018-04-06 DIAGNOSIS — L0231 Cutaneous abscess of buttock: Secondary | ICD-10-CM | POA: Diagnosis not present

## 2018-04-06 DIAGNOSIS — I10 Essential (primary) hypertension: Secondary | ICD-10-CM | POA: Diagnosis not present

## 2018-04-06 DIAGNOSIS — Z87891 Personal history of nicotine dependence: Secondary | ICD-10-CM | POA: Diagnosis not present

## 2018-04-06 DIAGNOSIS — Z79899 Other long term (current) drug therapy: Secondary | ICD-10-CM | POA: Diagnosis not present

## 2018-04-06 DIAGNOSIS — Z7982 Long term (current) use of aspirin: Secondary | ICD-10-CM | POA: Diagnosis not present

## 2018-04-20 DIAGNOSIS — N9419 Other specified dyspareunia: Secondary | ICD-10-CM | POA: Diagnosis not present

## 2018-04-20 DIAGNOSIS — E559 Vitamin D deficiency, unspecified: Secondary | ICD-10-CM | POA: Diagnosis not present

## 2018-04-20 DIAGNOSIS — R51 Headache: Secondary | ICD-10-CM | POA: Diagnosis not present

## 2018-04-20 DIAGNOSIS — R5383 Other fatigue: Secondary | ICD-10-CM | POA: Diagnosis not present

## 2018-04-20 DIAGNOSIS — E2839 Other primary ovarian failure: Secondary | ICD-10-CM | POA: Diagnosis not present

## 2018-07-27 DIAGNOSIS — E2839 Other primary ovarian failure: Secondary | ICD-10-CM | POA: Diagnosis not present

## 2018-07-27 DIAGNOSIS — N9419 Other specified dyspareunia: Secondary | ICD-10-CM | POA: Diagnosis not present

## 2018-07-27 DIAGNOSIS — E559 Vitamin D deficiency, unspecified: Secondary | ICD-10-CM | POA: Diagnosis not present

## 2018-07-27 DIAGNOSIS — E039 Hypothyroidism, unspecified: Secondary | ICD-10-CM | POA: Diagnosis not present

## 2018-10-26 DIAGNOSIS — E559 Vitamin D deficiency, unspecified: Secondary | ICD-10-CM | POA: Diagnosis not present

## 2018-10-26 DIAGNOSIS — Z0000 Encounter for general adult medical examination without abnormal findings: Secondary | ICD-10-CM | POA: Diagnosis not present

## 2018-10-26 DIAGNOSIS — R6882 Decreased libido: Secondary | ICD-10-CM | POA: Diagnosis not present

## 2018-10-26 DIAGNOSIS — E039 Hypothyroidism, unspecified: Secondary | ICD-10-CM | POA: Diagnosis not present

## 2018-10-26 DIAGNOSIS — E2839 Other primary ovarian failure: Secondary | ICD-10-CM | POA: Diagnosis not present

## 2018-10-26 DIAGNOSIS — R5383 Other fatigue: Secondary | ICD-10-CM | POA: Diagnosis not present

## 2018-10-26 DIAGNOSIS — N9419 Other specified dyspareunia: Secondary | ICD-10-CM | POA: Diagnosis not present

## 2018-10-30 DIAGNOSIS — R05 Cough: Secondary | ICD-10-CM | POA: Diagnosis not present

## 2018-10-30 DIAGNOSIS — R509 Fever, unspecified: Secondary | ICD-10-CM | POA: Diagnosis not present

## 2019-01-05 ENCOUNTER — Encounter: Payer: Self-pay | Admitting: Family Medicine

## 2019-01-23 ENCOUNTER — Other Ambulatory Visit: Payer: Self-pay

## 2019-01-23 ENCOUNTER — Other Ambulatory Visit: Payer: BLUE CROSS/BLUE SHIELD

## 2019-01-23 ENCOUNTER — Telehealth: Payer: Self-pay | Admitting: *Deleted

## 2019-01-23 DIAGNOSIS — Z20822 Contact with and (suspected) exposure to covid-19: Secondary | ICD-10-CM

## 2019-01-23 NOTE — Telephone Encounter (Signed)
Dr Anastasio Champion is calling to request testing for patient:  Fever and exposure to + COVID

## 2019-01-31 ENCOUNTER — Other Ambulatory Visit: Payer: Self-pay

## 2019-01-31 ENCOUNTER — Telehealth: Payer: Self-pay

## 2019-01-31 ENCOUNTER — Telehealth: Payer: Self-pay | Admitting: *Deleted

## 2019-01-31 DIAGNOSIS — Z20822 Contact with and (suspected) exposure to covid-19: Secondary | ICD-10-CM

## 2019-01-31 NOTE — Telephone Encounter (Addendum)
Dr. Hurshel Party,  from [facility] calling to request COVID-19 testing for the pt. Pt was scheduled previously but provider reports that [facility] has lost the test.  Pt called and scheduled for testing at the [facility] in [place]. Pt advised to wear a mask and remain in car at time of appt. Understanding verbalized.

## 2019-01-31 NOTE — Telephone Encounter (Signed)
Pt. Calling for COVID 19 test results done 01/23/19. Attempted to call [facility], no response after 15 minutes. Pt. Will check back this afternoon.

## 2019-02-01 ENCOUNTER — Ambulatory Visit (INDEPENDENT_AMBULATORY_CARE_PROVIDER_SITE_OTHER): Payer: BLUE CROSS/BLUE SHIELD | Admitting: Internal Medicine

## 2019-02-01 DIAGNOSIS — E039 Hypothyroidism, unspecified: Secondary | ICD-10-CM | POA: Diagnosis not present

## 2019-02-01 DIAGNOSIS — E2839 Other primary ovarian failure: Secondary | ICD-10-CM | POA: Diagnosis not present

## 2019-02-01 DIAGNOSIS — E559 Vitamin D deficiency, unspecified: Secondary | ICD-10-CM | POA: Diagnosis not present

## 2019-02-03 NOTE — Telephone Encounter (Signed)
Pt informed of Covd 19 positive. Discussed self isolation, ending isolation.Routing to Dr Anastasio Champion who ordered the testing.  Please forward testing result to [facility].

## 2019-02-12 ENCOUNTER — Telehealth: Payer: Self-pay

## 2019-02-12 LAB — NOVEL CORONAVIRUS, NAA: SARS-CoV-2, NAA: DETECTED — AB

## 2019-02-12 NOTE — Telephone Encounter (Signed)
Leah Cuevas with [facility] reports positive COVID 19 TEST. Lab already sent to PCP.

## 2019-02-13 ENCOUNTER — Encounter: Payer: Self-pay | Admitting: Family Medicine

## 2019-02-13 NOTE — Progress Notes (Signed)
Per discussion with her PCP, we will take her off our patient panel list.    Leah Cuevas Female, 58 y.o., [birth year] MRN:  [id number] Phone:  [phone] Leah Cuevas) PCP:  Shirley, [place], DO Coverage:  None Message Received: Today Message Contents  Shirley, [place], [place]; Kinnie Feil, MD        Patient was aware of her COVID test results as she is an established patient with Dr. Anastasio Champion in [place], [place]. She no longer needs to be seen in our clinic here and does not wish to re-establish care. Please remove this patient form our list.   Patient also reports that she has attempted to obtain her medical records from our office multiple times in order to give to her current PCP. She was inquiring whether or not these records have been sent as she believes she had already filled out the proper paperwork. Please contact patient to ensure that her medical records have been sent to her current PCP.   [place] Shirley, DO  PGY-2, [facility]

## 2019-05-15 ENCOUNTER — Encounter (INDEPENDENT_AMBULATORY_CARE_PROVIDER_SITE_OTHER): Payer: Self-pay | Admitting: Internal Medicine

## 2019-05-15 ENCOUNTER — Ambulatory Visit (INDEPENDENT_AMBULATORY_CARE_PROVIDER_SITE_OTHER): Payer: BC Managed Care – PPO | Admitting: Internal Medicine

## 2019-05-15 ENCOUNTER — Other Ambulatory Visit: Payer: Self-pay

## 2019-05-15 VITALS — BP 120/70 | HR 60 | Ht 64.0 in | Wt 134.6 lb

## 2019-05-15 DIAGNOSIS — Z0001 Encounter for general adult medical examination with abnormal findings: Secondary | ICD-10-CM

## 2019-05-15 DIAGNOSIS — R6882 Decreased libido: Secondary | ICD-10-CM

## 2019-05-15 DIAGNOSIS — Z124 Encounter for screening for malignant neoplasm of cervix: Secondary | ICD-10-CM

## 2019-05-15 DIAGNOSIS — Z1159 Encounter for screening for other viral diseases: Secondary | ICD-10-CM

## 2019-05-15 DIAGNOSIS — Z1239 Encounter for other screening for malignant neoplasm of breast: Secondary | ICD-10-CM | POA: Diagnosis not present

## 2019-05-15 DIAGNOSIS — Z131 Encounter for screening for diabetes mellitus: Secondary | ICD-10-CM | POA: Diagnosis not present

## 2019-05-15 DIAGNOSIS — E2839 Other primary ovarian failure: Secondary | ICD-10-CM | POA: Diagnosis not present

## 2019-05-15 DIAGNOSIS — E039 Hypothyroidism, unspecified: Secondary | ICD-10-CM

## 2019-05-15 DIAGNOSIS — E559 Vitamin D deficiency, unspecified: Secondary | ICD-10-CM

## 2019-05-15 DIAGNOSIS — K219 Gastro-esophageal reflux disease without esophagitis: Secondary | ICD-10-CM

## 2019-05-15 DIAGNOSIS — F5101 Primary insomnia: Secondary | ICD-10-CM

## 2019-05-15 DIAGNOSIS — Z13220 Encounter for screening for lipoid disorders: Secondary | ICD-10-CM | POA: Diagnosis not present

## 2019-05-15 NOTE — Progress Notes (Signed)
Chief Complaint: This 58 year old lady comes in for an annual physical exam and to address her chronic conditions which are described below. HPI: She is doing reasonably well.  She continues to have decreased/almost none libido.  She also has dyspareunia and this was helped in the past by testosterone cream applied to the [place].  However this caused discharge and was uncomfortable and she discontinued this. She also has trouble with insomnia.  She does tend to go to sleep but then wakes up in the middle of the night and cannot go back to sleep very easily.  Also, her husband tends to wake up in the middle of the night and this wakes her up.  She usually is good about not watching television before going to bed, avoiding caffeinated products and making sure her bedroom is cool. She continues on estradiol and progesterone for bioidentical hormone optimization and she seems to tolerate these well. She also has hypothyroidism for which she takes desiccated NP thyroid and is tolerating this quite well. She denies any chest pain, dyspnea, palpitations or limb weakness.  Past Medical History:  Diagnosis Date  . Allergy   . Anemia   . Esophagitis   . Hyperplastic polyp of intestine   . Non-celiac gluten sensitivity   . Rotator cuff impingement syndrome   . Thyroid disease    Past Surgical History:  Procedure Laterality Date  . APPENDECTOMY    . BACTERIAL OVERGROWTH TEST N/A 02/10/2016   Procedure: BACTERIAL OVERGROWTH TEST;  Surgeon: Danie Binder, MD;  [facility];  Service: Endoscopy;  Laterality: N/A;  0700  . BACTERIAL OVERGROWTH TEST N/A 04/13/2016   Procedure: BACTERIAL OVERGROWTH TEST;  Surgeon: Danie Binder, MD;  [facility];  Service: Endoscopy;  Laterality: N/A;  0700  . CESAREAN SECTION  1998  . COLONOSCOPY  2006 DB   HYPERPLASTIC POLYP  . COLONOSCOPY N/A 09/12/2015   Procedure: COLONOSCOPY;  Surgeon: Danie Binder, MD;  [facility];   Service: Endoscopy;  Laterality: N/A;  1115   . DILATION AND CURETTAGE OF UTERUS  1997   s/p spontaneous abortion  . ESOPHAGOGASTRODUODENOSCOPY N/A 10/17/2015   Procedure: ESOPHAGOGASTRODUODENOSCOPY (EGD);  Surgeon: Danie Binder, MD;  [facility];  Service: Endoscopy;  Laterality: N/A;  830   . EXPLORATORY LAPROSCOPY     Dx PID  . 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        Allergies:  Allergies  Allergen Reactions  . Penicillins     REACTION: Vomiting. No rashes or trouble breathing. Has patient had a PCN reaction causing immediate rash, facial/tongue/throat swelling, SOB or lightheadedness with hypotension: No Has patient had a PCN reaction causing severe rash involving mucus membranes or skin necrosis: No Has patient had a PCN reaction that required hospitalization No Has patient had a PCN reaction occurring within the last 10 years: No If all of the above answers are "NO", then may proceed with Cephalosporin use.      Current Meds  Medication Sig  . Cholecalciferol (VITAMIN D-3) 125 MCG (5000 UT) TABS Take 1 tablet by mouth daily.  Marland Kitchen estradiol (ESTRACE) 2 MG tablet Take 1 tablet by mouth daily.  . Melatonin 10 MG CAPS Take 1 tablet by mouth daily.  . NP THYROID 120 MG tablet Take 1 tablet by mouth  daily.  . progesterone (PROMETRIUM) 200 MG capsule Take 1 capsule by mouth every evening.     Nutrition Her nutrition is fairly good.  She keeps to a schedule.  She avoids eating unhealthy foods overall.  Sleep Continues to have insomnia as mentioned above.  Exercise She is back at the gym now 3 times a week doing a combination of cardio training and strength training.  Bio-identical hormones Micronized progesterone is being used in this patient for multiple benefits based on studies including protection against uterine  cancer, breast cancer, osteoporosis and heart disease. The patient has been counseled regarding side effects, benefits and modes of administration. The patient is agreeable that this therapy is an integral part of her wellness, quality of life and prevention of chronic disease.  Estradiol is being used in this patient for multiple benefits based on several studies including protection against heart disease, cerebrovascular disease, osteoporosis, colon cancer, Alzheimer's disease, macular degeneration and cataracts. The patient has been counseled regarding benefits and side effects and modes of administration. The patient is agreeable that this therapy is an integral to part of her wellness, quality of life and prevention of chronic disease.  [id number] from the symptoms mentioned above,there are no other symptoms referable to all systems reviewed.  Physical Exam: Blood pressure 120/70, pulse 60, height 5\' 4"  (1.626 m), weight 134 lb 9.6 oz (61.1 kg). Vitals with BMI 05/15/2019 10/26/2016 10/26/2016  Height 5\' 4"  - -  Weight 134 lbs 10 oz - -  BMI 123456 - -  Systolic 123456 [id number]  Diastolic 70 57 99  Pulse 60 56 76      She looks systemically well.  Her weight is steady. General: Alert, cooperative, and appears to be the stated age.No pallor.  No jaundice.  No clubbing. Head: Normocephalic Eyes: Sclera white, pupils equal and reactive to light, red reflex x 2,  Ears: Normal bilaterally Oral cavity: Lips, mucosa, and tongue normal: Teeth and gums normal Neck: No adenopathy, supple, symmetrical, trachea midline, and thyroid does not appear enlarged Breast: No masses found. Respiratory: Clear to auscultation bilaterally.No wheezing, crackles or bronchial breathing. Cardiovascular: Heart sounds are present and appear to be normal without murmurs or added sounds.  No carotid bruits.  Peripheral pulses are present and equal bilaterally.: Soft, nontender, Gastrointestinal:positive bowel sounds, no  hepatosplenomegaly.  No masses felt.No tenderness. Skin: Clear, No rashes noted.No worrisome skin lesions seen. Neurological: Grossly intact without focal findings, cranial nerves II through XII intact, muscle strength equal bilaterally Musculoskeletal: No acute joint abnormalities noted.Full range of movement noted with joints. Psychiatric: Affect appropriate, non-anxious.    Assessment  1. Gastroesophageal reflux disease, esophagitis presence not specified   2. Acquired hypothyroidism   3. Vitamin D deficiency disease   4. Breast cancer screening   5. Cervical cancer screening   6. Encounter for hepatitis C screening test for low risk patient   7. Decreased libido   8. Encounter for general adult medical examination with abnormal findings   9. Primary ovarian failure   10. Screening for lipoid disorders   11. Screening for diabetes mellitus   12. Primary insomnia     Tests Ordered:   Orders Placed This Encounter  Procedures  . MM Digital Screening  . CBC  . COMPLETE METABOLIC PANEL WITH GFR  . Estradiol  . Hepatitis C antibody  . Lipid panel  . Progesterone  . T3, free  . T4  . TSH  . Testos,Total,Free and SHBG (Female)  .  VITAMIN D 25 Hydroxy (Vit-D Deficiency, Fractures)  . Hemoglobin A1c  . Ambulatory referral to Obstetrics / Gynecology     Plan  1. She will continue with all medications for chronic conditions above. 2. We discussed insomnia and other ways in which she can help herself.  Taking a hot shower in the evening may help her.  Increasing doses of melatonin may also help her.  She will try both of these. 3.  We also discussed testosterone therapy again at length.  I offered the possibility of testosterone injections or even micronized testosterone capsule.  She would like to see what the levels are first which I tend to agree with.  For the dyspareunia we discussed the possibility of estradiol cream together with testosterone cream combined. 4. I will  send her for a Pap smear and mammogram which she is overdue on. 5. She is currently declining influenza vaccination. 6. Further recommendations will depend on blood results and I will see her in about 4 months time for follow-up. 7. Today, in addition to a preventative visit, I performed an office visit to address her chronic conditions and symptoms above.        Cailey Trigueros C Batul Diego   05/15/2019, 9:33 AM

## 2019-05-16 NOTE — Progress Notes (Signed)
Your hepatitis C test is negative which is good news.  All of your other blood tests are fantastic, keep up the good work!  The only test I am waiting for your testosterone level and I will let you know once I get it.  If you have any questions, do not hesitate to contact me through my chart.  Be well!

## 2019-05-17 ENCOUNTER — Ambulatory Visit (HOSPITAL_COMMUNITY): Payer: BC Managed Care – PPO

## 2019-05-21 ENCOUNTER — Encounter (INDEPENDENT_AMBULATORY_CARE_PROVIDER_SITE_OTHER): Payer: Self-pay | Admitting: Internal Medicine

## 2019-05-21 ENCOUNTER — Other Ambulatory Visit (INDEPENDENT_AMBULATORY_CARE_PROVIDER_SITE_OTHER): Payer: Self-pay | Admitting: Internal Medicine

## 2019-05-21 LAB — T4: T4, Total: 7.9 ug/dL (ref 5.1–11.9)

## 2019-05-21 LAB — LIPID PANEL
Cholesterol: 160 mg/dL (ref <200)
HDL: 54 mg/dL (ref >=50)
LDL Cholesterol (Calc): 87 mg/dL (calc)
Non-HDL Cholesterol (Calc): 106 mg/dL (calc) (ref <130)
Total CHOL/HDL Ratio: 3 (calc) (ref <5.0)
Triglycerides: 91 mg/dL (ref <150)

## 2019-05-21 LAB — TESTOS,TOTAL,FREE AND SHBG (FEMALE)
Free Testosterone: 0.2 pg/mL (ref 0.1–6.4)
Sex Hormone Binding: 137 nmol/L — ABNORMAL HIGH (ref 14–73)
Testosterone, Total, LC-MS-MS: 5 ng/dL (ref 2–45)

## 2019-05-21 LAB — COMPLETE METABOLIC PANEL WITH GFR
AG Ratio: 1.6 (calc) (ref 1.0–2.5)
ALT: 10 U/L (ref 6–29)
AST: 21 U/L (ref 10–35)
Albumin: 4.4 g/dL (ref 3.6–5.1)
Alkaline phosphatase (APISO): 59 U/L (ref 37–153)
BUN: 15 mg/dL (ref 7–25)
CO2: 24 mmol/L (ref 20–32)
Calcium: 9.4 mg/dL (ref 8.6–10.4)
Chloride: 102 mmol/L (ref 98–110)
Creat: 0.86 mg/dL (ref 0.50–1.05)
GFR, Est African American: 87 mL/min/{1.73_m2} (ref >=60)
GFR, Est Non African American: 75 mL/min/{1.73_m2} (ref >=60)
Globulin: 2.8 g/dL (calc) (ref 1.9–3.7)
Glucose, Bld: 90 mg/dL (ref 65–99)
Potassium: 3.9 mmol/L (ref 3.5–5.3)
Sodium: 137 mmol/L (ref 135–146)
Total Bilirubin: 0.9 mg/dL (ref 0.2–1.2)
Total Protein: 7.2 g/dL (ref 6.1–8.1)

## 2019-05-21 LAB — HEPATITIS C ANTIBODY
Hepatitis C Ab: NONREACTIVE
SIGNAL TO CUT-OFF: 0.02 (ref <1.00)

## 2019-05-21 LAB — CBC
HCT: 41.2 % (ref 35.0–45.0)
Hemoglobin: 13.2 g/dL (ref 11.7–15.5)
MCH: 25.8 pg — ABNORMAL LOW (ref 27.0–33.0)
MCHC: 32 g/dL (ref 32.0–36.0)
MCV: 80.6 fL (ref 80.0–100.0)
MPV: 11.9 fL (ref 7.5–12.5)
Platelets: 233 10*3/uL (ref 140–400)
RBC: 5.11 10*6/uL — ABNORMAL HIGH (ref 3.80–5.10)
RDW: 14.5 % (ref 11.0–15.0)
WBC: 5.7 10*3/uL (ref 3.8–10.8)

## 2019-05-21 LAB — ESTRADIOL: Estradiol: 73 pg/mL

## 2019-05-21 LAB — HEMOGLOBIN A1C
Hgb A1c MFr Bld: 5.2 % of total Hgb (ref <5.7)
Mean Plasma Glucose: 103 (calc)
eAG (mmol/L): 5.7 (calc)

## 2019-05-21 LAB — PROGESTERONE: Progesterone: 14.4 ng/mL

## 2019-05-21 LAB — T3, FREE: T3, Free: 5.2 pg/mL — ABNORMAL HIGH (ref 2.3–4.2)

## 2019-05-21 LAB — TSH: TSH: 3.18 mIU/L (ref 0.40–4.50)

## 2019-05-21 LAB — VITAMIN D 25 HYDROXY (VIT D DEFICIENCY, FRACTURES): Vit D, 25-Hydroxy: 67 ng/mL (ref 30–100)

## 2019-05-21 MED ORDER — NF FORMULAS TESTOSTERONE PO CAPS: 15.0000 mg | ORAL_CAPSULE | Freq: Every day | ORAL | 2 refills | Status: DC

## 2019-05-23 ENCOUNTER — Ambulatory Visit (HOSPITAL_COMMUNITY)
Admission: RE | Admit: 2019-05-23 | Discharge: 2019-05-23 | Disposition: A | Discharge status: Home / Self Care | Payer: BC Managed Care – PPO | Source: Ambulatory Visit | Attending: Internal Medicine | Admitting: Internal Medicine

## 2019-05-23 ENCOUNTER — Other Ambulatory Visit: Payer: Self-pay

## 2019-05-23 DIAGNOSIS — Z1231 Encounter for screening mammogram for malignant neoplasm of breast: Secondary | ICD-10-CM | POA: Diagnosis not present

## 2019-05-23 DIAGNOSIS — Z1239 Encounter for other screening for malignant neoplasm of breast: Secondary | ICD-10-CM

## 2019-05-24 ENCOUNTER — Other Ambulatory Visit (HOSPITAL_COMMUNITY): Payer: Self-pay | Admitting: Internal Medicine

## 2019-05-24 DIAGNOSIS — R928 Other abnormal and inconclusive findings on diagnostic imaging of breast: Secondary | ICD-10-CM

## 2019-05-30 ENCOUNTER — Ambulatory Visit (HOSPITAL_COMMUNITY): Admission: RE | Admit: 2019-05-30 | Payer: BC Managed Care – PPO | Source: Ambulatory Visit

## 2019-05-30 ENCOUNTER — Other Ambulatory Visit: Payer: Self-pay

## 2019-05-30 ENCOUNTER — Ambulatory Visit (HOSPITAL_COMMUNITY)
Admission: RE | Admit: 2019-05-30 | Discharge: 2019-05-30 | Disposition: A | Discharge status: Home / Self Care | Payer: BC Managed Care – PPO | Source: Ambulatory Visit | Attending: Internal Medicine | Admitting: Internal Medicine

## 2019-05-30 DIAGNOSIS — R922 Inconclusive mammogram: Secondary | ICD-10-CM | POA: Diagnosis not present

## 2019-05-30 DIAGNOSIS — R928 Other abnormal and inconclusive findings on diagnostic imaging of breast: Secondary | ICD-10-CM | POA: Diagnosis not present

## 2019-05-31 ENCOUNTER — Other Ambulatory Visit (INDEPENDENT_AMBULATORY_CARE_PROVIDER_SITE_OTHER): Payer: Self-pay | Admitting: Internal Medicine

## 2019-06-01 ENCOUNTER — Other Ambulatory Visit (INDEPENDENT_AMBULATORY_CARE_PROVIDER_SITE_OTHER): Payer: Self-pay | Admitting: Internal Medicine

## 2019-06-14 ENCOUNTER — Other Ambulatory Visit (INDEPENDENT_AMBULATORY_CARE_PROVIDER_SITE_OTHER): Payer: Self-pay | Admitting: Internal Medicine

## 2019-06-21 ENCOUNTER — Other Ambulatory Visit: Payer: Self-pay

## 2019-06-21 ENCOUNTER — Ambulatory Visit (INDEPENDENT_AMBULATORY_CARE_PROVIDER_SITE_OTHER): Payer: BC Managed Care – PPO | Admitting: Adult Health

## 2019-06-21 ENCOUNTER — Encounter: Payer: Self-pay | Admitting: Adult Health

## 2019-06-21 ENCOUNTER — Other Ambulatory Visit (HOSPITAL_COMMUNITY)
Admission: RE | Admit: 2019-06-21 | Discharge: 2019-06-21 | Disposition: A | Discharge status: Home / Self Care | Payer: BC Managed Care – PPO | Source: Ambulatory Visit | Attending: Adult Health | Admitting: Adult Health

## 2019-06-21 VITALS — BP 116/62 | HR 59 | Ht 64.75 in | Wt 142.0 lb

## 2019-06-21 DIAGNOSIS — Z01419 Encounter for gynecological examination (general) (routine) without abnormal findings: Secondary | ICD-10-CM

## 2019-06-21 DIAGNOSIS — Z1211 Encounter for screening for malignant neoplasm of colon: Secondary | ICD-10-CM

## 2019-06-21 DIAGNOSIS — R10814 Left lower quadrant abdominal tenderness: Secondary | ICD-10-CM

## 2019-06-21 DIAGNOSIS — Z1212 Encounter for screening for malignant neoplasm of rectum: Secondary | ICD-10-CM

## 2019-06-21 LAB — HEMOCCULT GUIAC POC 1CARD (OFFICE): Fecal Occult Blood, POC: NEGATIVE

## 2019-06-21 NOTE — Progress Notes (Signed)
Patient ID: KNOX BURWELL, female   DOB: 1960/09/25, 58 y.o.   MRN: [id number] History of Present Illness: Leah Cuevas is a 58 year old white female, married, PM on HRT, had physical with PCP, here for a pap. PCP is Dr Anastasio Champion.   Current Medications, Allergies, Past Medical History, Past Surgical History, Family History and Social History were reviewed in [employer] record.     Review of Systems: Patient denies any headaches, hearing loss, fatigue, blurred vision, shortness of breath, chest pain, abdominal pain, problems with bowel movements, urination, or intercourse(has discomfort, at opening, had been about 7 years since sex, husband had been in jail, but is working on sex, using lubricates). No joint pain or mood swings.     Physical Exam:BP 116/62 (BP Location: Left Arm, Patient Position: Sitting, Cuff Size: Normal)   Pulse (!) 59   Ht 5' 4.75" (1.645 m)   Wt 142 lb (64.4 kg)   BMI 23.81 kg/m  General:  Well developed, well nourished, no acute distress Skin:  Warm and dry Neck:  Midline trachea, normal thyroid, good ROM, no lymphadenopathy Lungs; Clear to auscultation bilaterally Cardiovascular: Regular rate and rhythm Pelvic:  External genitalia is normal in appearance, no lesions.  The vagina is normal in appearance. Urethra has no lesions or masses. The cervix is smooth with stenotic os, pap with high risk HPV 16/18 genotyping performed.Marland Kitchen  Uterus is felt to be normal size, shape, and contour.  No adnexal masses felt, but has LLQ tenderness noted, (she has history of cyst).Bladder is non tender, no masses felt. Rectal: Good sphincter tone, no polyps, or hemorrhoids felt.  Hemoccult negative. Extremities/musculoskeletal:  No swelling or varicosities noted, no clubbing or cyanosis Psych:  No mood changes, alert and cooperative,seems happy Fall risk is low PHQ 2 score 0. Co exam with Weyman Croon FNP student.   Impression and Plan. 1. Encounter for  gynecological examination with Papanicolaou smear of cervix Pap sent Physical with PCP Pap in 3 years if normal Labs with PCP  Mammogram yearly  2. Screening for colorectal cancer Colonoscopy per GI  3. Left lower quadrant abdominal tenderness without rebound tenderness Will get GYN [place] in about 2 weeks, will talk after that

## 2019-06-27 LAB — CYTOLOGY - PAP
Adequacy: ABSENT
Comment: NEGATIVE
Comment: NEGATIVE
Comment: NEGATIVE
Diagnosis: NEGATIVE
HPV 16: NEGATIVE
HPV 18 / 45: NEGATIVE
High risk HPV: POSITIVE — AB

## 2019-06-28 ENCOUNTER — Encounter: Payer: Self-pay | Admitting: Adult Health

## 2019-06-28 ENCOUNTER — Telehealth: Payer: Self-pay | Admitting: Adult Health

## 2019-06-28 DIAGNOSIS — IMO0002 Reserved for concepts with insufficient information to code with codable children: Secondary | ICD-10-CM | POA: Insufficient documentation

## 2019-06-28 DIAGNOSIS — B977 Papillomavirus as the cause of diseases classified elsewhere: Secondary | ICD-10-CM

## 2019-06-28 HISTORY — DX: Reserved for concepts with insufficient information to code with codable children: IMO0002

## 2019-06-28 HISTORY — DX: Papillomavirus as the cause of diseases classified elsewhere: B97.7

## 2019-06-28 NOTE — Telephone Encounter (Signed)
Pt aware pap negative for malignancy and 16,18,45 HPV but + HPV, will repeat HPV in 1 year

## 2019-07-05 ENCOUNTER — Other Ambulatory Visit: Payer: Self-pay

## 2019-07-05 ENCOUNTER — Ambulatory Visit (INDEPENDENT_AMBULATORY_CARE_PROVIDER_SITE_OTHER): Payer: BC Managed Care – PPO

## 2019-07-05 DIAGNOSIS — R10814 Left lower quadrant abdominal tenderness: Secondary | ICD-10-CM | POA: Diagnosis not present

## 2019-07-05 NOTE — Progress Notes (Signed)
PELVIC US TA/TV: homogeneous anteverted uterus,wnl,EEC 6 mm,thickened endometrium,simple nabothian cyst 1.2 x .4 x .9 cm,limited view of right ovary,normal left ovary,unable to slide left ovary,left adnexal pain during ultrasound,no free fluid

## 2019-08-03 ENCOUNTER — Other Ambulatory Visit (INDEPENDENT_AMBULATORY_CARE_PROVIDER_SITE_OTHER): Payer: Self-pay | Admitting: Internal Medicine

## 2019-09-04 ENCOUNTER — Other Ambulatory Visit (INDEPENDENT_AMBULATORY_CARE_PROVIDER_SITE_OTHER): Payer: Self-pay | Admitting: Internal Medicine

## 2019-09-13 ENCOUNTER — Other Ambulatory Visit: Payer: Self-pay

## 2019-09-13 ENCOUNTER — Encounter (INDEPENDENT_AMBULATORY_CARE_PROVIDER_SITE_OTHER): Payer: Self-pay | Admitting: Internal Medicine

## 2019-09-13 ENCOUNTER — Ambulatory Visit (INDEPENDENT_AMBULATORY_CARE_PROVIDER_SITE_OTHER): Payer: BC Managed Care – PPO | Admitting: Internal Medicine

## 2019-09-13 VITALS — BP 130/72 | HR 82 | Temp 97.6°F | Resp 15 | Ht 64.0 in | Wt 134.8 lb

## 2019-09-13 DIAGNOSIS — E2839 Other primary ovarian failure: Secondary | ICD-10-CM | POA: Diagnosis not present

## 2019-09-13 DIAGNOSIS — E559 Vitamin D deficiency, unspecified: Secondary | ICD-10-CM

## 2019-09-13 DIAGNOSIS — E039 Hypothyroidism, unspecified: Secondary | ICD-10-CM | POA: Diagnosis not present

## 2019-09-13 NOTE — Progress Notes (Signed)
Metrics: Intervention Frequency ACO  Documented Smoking Status Yearly  Screened one or more times in 24 months  Cessation Counseling or  Active cessation medication Past 24 months  Past 24 months   Guideline developer: UpToDate (See UpToDate for funding source) Date Released: 2014       Wellness Office Visit  Subjective:  Patient ID: Leah Cuevas, female    DOB: [birth year]  Age: 59 y.o. MRN: [id number]  CC: This lady comes in for follow-up of bioidentical hormone therapy and postmenopausal state.  She also has a history of hypothyroidism and vitamin D deficiency. HPI  From the last visit, she started testosterone micronized capsules 15 mg daily and she feels that she has more energy.  She tells me that she and her husband are having sex on a more regular basis now. She continues with estradiol and progesterone and the levels were in a good range.  Her testosterone level was very low. She continues with vitamin D3 supplementation and levels were good. She also continues with NP thyroid and her free T3 levels were optimal. Past Medical History:  Diagnosis Date  . Allergy   . Anemia   . Esophagitis   . 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 Hyperplastic polyp of intestine   . Non-celiac gluten sensitivity   . Rotator cuff impingement syndrome   . Thyroid disease   . Vaginal Pap smear, abnormal       Family History  Problem Relation Age of Onset  . Coronary artery disease Mother   . Heart Problems Brother   . Hypertension Brother   . Heart Problems Brother   . Hypertension Brother   . Colon cancer Paternal Uncle   . Lung cancer Father   . 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   Social History   Tobacco Use  . Smoking status: Never Smoker  . Smokeless tobacco: Never Used  Substance Use Topics  . Alcohol use: No   Alcohol/week: 0.0 standard drinks    Current Meds  Medication Sig  . Cholecalciferol (VITAMIN D-3) 125 MCG (5000 UT) TABS Take 1 tablet by mouth daily.  Marland Kitchen estradiol (ESTRACE) 2 MG tablet TAKE 1 TABLET BY MOUTH ONCE DAILY.  . Melatonin 10 MG CAPS Take 1 mL by mouth daily.   . Misc Natural Products (NF FORMULAS TESTOSTERONE) CAPS Take 15 mg by mouth daily.  . NP THYROID 120 MG tablet TAKE 1 TABLET BY MOUTH DAILY.  . progesterone (PROMETRIUM) 200 MG capsule TAKE (1) CAPSULE BY MOUTH AT BEDTIME.     Nutrition  She has done very well with nutrition since the last time I saw her and has managed to lose weight.  She has been watching what she eats and also has been exercising on a regular basis especially with weightbearing exercises. Sleep  Sleep is still an issue and she has been trying melatonin liquid from [employer].  She can go to sleep but then cannot maintain sleep.    Objective:   Today's Vitals: BP 130/72   Pulse 82   Temp 97.6 F (36.4 C) (Temporal)   Resp 15   Ht 5\' 4"  (1.626 m)   Wt 134 lb 12.8 oz (61.1 kg)   SpO2 98%   BMI 23.14 kg/m  Vitals with BMI 09/13/2019 06/21/2019 05/15/2019  Height 5\' 4"  5' 4.75" 5\' 4"   Weight  134 lbs 13 oz 142 lbs 134 lbs 10 oz  BMI 23.13 [id number]  Systolic [id number]  Diastolic 72 62 70  Pulse 82 59 60     Physical Exam   She looks systemically well.  She has lost 8 pounds since the last time I saw her which is fantastic.    Assessment   1. Primary ovarian failure   2. Acquired hypothyroidism   3. Vitamin D deficiency disease       Tests ordered Orders Placed This Encounter  Procedures  . Testos,Total,Free and SHBG (Female)     Plan: 1. Blood work is ordered above to see whether we need to adjust testosterone dose further.  Hopefully there is an increase in free testosterone level. 2. She will continue with the same dose of NP thyroid as levels were good. 3. She will continue with the same dose of vitamin D3.   These are all stable. 4. We talked a little bit about sleep hygiene. 5. Further recommendations will depend on blood results and I will see her in 4 months for follow-up.   No orders of the defined types were placed in this encounter.   Doree Albee, MD

## 2019-09-18 LAB — TESTOS,TOTAL,FREE AND SHBG (FEMALE)
Free Testosterone: 18.9 pg/mL — ABNORMAL HIGH (ref 0.1–6.4)
Sex Hormone Binding: 175 nmol/L — ABNORMAL HIGH (ref 14–73)
Testosterone, Total, LC-MS-MS: 364 ng/dL — ABNORMAL HIGH (ref 2–45)

## 2019-10-03 ENCOUNTER — Other Ambulatory Visit (INDEPENDENT_AMBULATORY_CARE_PROVIDER_SITE_OTHER): Payer: Self-pay | Admitting: Nurse Practitioner

## 2019-10-12 ENCOUNTER — Other Ambulatory Visit (INDEPENDENT_AMBULATORY_CARE_PROVIDER_SITE_OTHER): Payer: Self-pay | Admitting: Internal Medicine

## 2019-10-29 ENCOUNTER — Encounter (INDEPENDENT_AMBULATORY_CARE_PROVIDER_SITE_OTHER): Payer: Self-pay | Admitting: Internal Medicine

## 2019-10-31 ENCOUNTER — Other Ambulatory Visit (INDEPENDENT_AMBULATORY_CARE_PROVIDER_SITE_OTHER): Payer: Self-pay | Admitting: Internal Medicine

## 2019-11-01 ENCOUNTER — Other Ambulatory Visit (INDEPENDENT_AMBULATORY_CARE_PROVIDER_SITE_OTHER): Payer: Self-pay | Admitting: Nurse Practitioner

## 2019-11-05 ENCOUNTER — Encounter (INDEPENDENT_AMBULATORY_CARE_PROVIDER_SITE_OTHER): Payer: Self-pay

## 2019-11-07 ENCOUNTER — Other Ambulatory Visit (INDEPENDENT_AMBULATORY_CARE_PROVIDER_SITE_OTHER): Payer: Self-pay | Admitting: Internal Medicine

## 2019-11-07 MED ORDER — ESTRADIOL 2 MG PO TABS: 2.0000 mg | ORAL_TABLET | Freq: Every day | ORAL | 3 refills | Status: DC

## 2019-11-09 ENCOUNTER — Other Ambulatory Visit (INDEPENDENT_AMBULATORY_CARE_PROVIDER_SITE_OTHER): Payer: Self-pay | Admitting: Internal Medicine

## 2019-11-09 DIAGNOSIS — R6889 Other general symptoms and signs: Secondary | ICD-10-CM

## 2019-11-09 DIAGNOSIS — R002 Palpitations: Secondary | ICD-10-CM

## 2019-11-19 ENCOUNTER — Other Ambulatory Visit (HOSPITAL_COMMUNITY)
Admission: RE | Admit: 2019-11-19 | Discharge: 2019-11-19 | Disposition: A | Discharge status: Home / Self Care | Payer: BC Managed Care – PPO | Source: Ambulatory Visit | Attending: Internal Medicine | Admitting: Internal Medicine

## 2019-11-19 ENCOUNTER — Other Ambulatory Visit: Payer: Self-pay

## 2019-11-19 DIAGNOSIS — Z20822 Contact with and (suspected) exposure to covid-19: Secondary | ICD-10-CM | POA: Diagnosis not present

## 2019-11-19 DIAGNOSIS — Z01812 Encounter for preprocedural laboratory examination: Secondary | ICD-10-CM | POA: Diagnosis not present

## 2019-11-19 LAB — SARS CORONAVIRUS 2 (TAT 6-24 HRS): SARS Coronavirus 2: NEGATIVE

## 2019-11-22 ENCOUNTER — Other Ambulatory Visit (HOSPITAL_COMMUNITY): Payer: Self-pay | Admitting: *Deleted

## 2019-11-22 ENCOUNTER — Other Ambulatory Visit: Payer: Self-pay

## 2019-11-22 ENCOUNTER — Ambulatory Visit (HOSPITAL_COMMUNITY): Payer: BC Managed Care – PPO | Attending: Internal Medicine

## 2019-11-22 DIAGNOSIS — R6889 Other general symptoms and signs: Secondary | ICD-10-CM | POA: Diagnosis not present

## 2019-11-22 DIAGNOSIS — R002 Palpitations: Secondary | ICD-10-CM

## 2019-11-22 DIAGNOSIS — R0600 Dyspnea, unspecified: Secondary | ICD-10-CM | POA: Diagnosis not present

## 2019-11-26 ENCOUNTER — Encounter (INDEPENDENT_AMBULATORY_CARE_PROVIDER_SITE_OTHER): Payer: Self-pay

## 2019-12-03 ENCOUNTER — Other Ambulatory Visit (INDEPENDENT_AMBULATORY_CARE_PROVIDER_SITE_OTHER): Payer: Self-pay | Admitting: Internal Medicine

## 2019-12-24 ENCOUNTER — Other Ambulatory Visit (INDEPENDENT_AMBULATORY_CARE_PROVIDER_SITE_OTHER): Payer: Self-pay | Admitting: Internal Medicine

## 2019-12-24 ENCOUNTER — Telehealth (INDEPENDENT_AMBULATORY_CARE_PROVIDER_SITE_OTHER): Payer: Self-pay

## 2019-12-24 MED ORDER — NF FORMULAS TESTOSTERONE PO CAPS: 15.0000 mg | ORAL_CAPSULE | Freq: Every day | ORAL | 3 refills | Status: DC

## 2019-12-24 NOTE — Telephone Encounter (Signed)
Leah Cuevas is asking for a refill on her Testosterone she is stating that the pharmacy refused it, please advise?

## 2020-01-08 ENCOUNTER — Ambulatory Visit (INDEPENDENT_AMBULATORY_CARE_PROVIDER_SITE_OTHER): Payer: BC Managed Care – PPO | Admitting: Internal Medicine

## 2020-01-08 ENCOUNTER — Encounter (INDEPENDENT_AMBULATORY_CARE_PROVIDER_SITE_OTHER): Payer: Self-pay | Admitting: Internal Medicine

## 2020-01-08 ENCOUNTER — Other Ambulatory Visit: Payer: Self-pay

## 2020-01-08 VITALS — BP 120/75 | HR 92 | Temp 97.5°F | Ht 64.0 in | Wt 126.2 lb

## 2020-01-08 DIAGNOSIS — E559 Vitamin D deficiency, unspecified: Secondary | ICD-10-CM

## 2020-01-08 DIAGNOSIS — R6889 Other general symptoms and signs: Secondary | ICD-10-CM

## 2020-01-08 DIAGNOSIS — E039 Hypothyroidism, unspecified: Secondary | ICD-10-CM

## 2020-01-08 DIAGNOSIS — E2839 Other primary ovarian failure: Secondary | ICD-10-CM | POA: Diagnosis not present

## 2020-01-08 NOTE — Progress Notes (Signed)
Metrics: Intervention Frequency ACO  Documented Smoking Status Yearly  Screened one or more times in 24 months  Cessation Counseling or  Active cessation medication Past 24 months  Past 24 months   Guideline developer: UpToDate (See UpToDate for funding source) Date Released: 2014       Wellness Office Visit  Subjective:  Patient ID: Leah Cuevas, female    DOB: [birth year]  Age: 59 y.o. MRN: [id number]  CC: This delightful lady comes in for follow-up regarding her hypothyroidism, hormone optimization.  HPI She underwent VO2 max testing in April and she is here also to discuss these results.  I discussed these results in detail.  It appears that she reached RER of 1.1 and a heart rate of 160/min.  Otherwise the VO2 max testing did not have any alarm bells. She continues on bioidentical hormone therapy and is tolerating it very well.  Her blood work levels of estradiol, progesterone and testosterone have been optimized. Her T3 levels are also optimized and she continues on NP thyroid. Vitamin D levels are also in a good range. She feels great.  She is sleeping well.  She has energy and she has managed to lose weight. She also complains of left shoulder pain but only this occurs when she does lateral raises when she does strength training.  She has had this pain for several years now.  Past Medical History:  Diagnosis Date  . Allergy   . Anemia   . Esophagitis   . 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 Hyperplastic polyp of intestine   . Non-celiac gluten sensitivity   . Rotator cuff impingement syndrome   . Thyroid disease   . Vaginal Pap smear, abnormal    Past Surgical History:  Procedure Laterality Date  . APPENDECTOMY    . BACTERIAL OVERGROWTH TEST N/A 02/10/2016   Procedure: BACTERIAL OVERGROWTH TEST;  Surgeon: Danie Binder, MD;  [facility];  Service: Endoscopy;  Laterality: N/A;  0700  . BACTERIAL OVERGROWTH TEST N/A 04/13/2016   Procedure: BACTERIAL OVERGROWTH TEST;  Surgeon: Danie Binder, MD;  [facility];  Service: Endoscopy;  Laterality: N/A;  0700  . CESAREAN SECTION  1998  . COLONOSCOPY  2006 DB   HYPERPLASTIC POLYP  . COLONOSCOPY N/A 09/12/2015   Procedure: COLONOSCOPY;  Surgeon: Danie Binder, MD;  [facility];  Service: Endoscopy;  Laterality: N/A;  1115   . DILATION AND CURETTAGE OF UTERUS  1997   s/p spontaneous abortion  . ESOPHAGOGASTRODUODENOSCOPY N/A 10/17/2015   Procedure: ESOPHAGOGASTRODUODENOSCOPY (EGD);  Surgeon: Danie Binder, MD;  [facility];  Service: Endoscopy;  Laterality: N/A;  830   . EXPLORATORY LAPROSCOPY     Dx PID  . Tendon reattachment  1999   Right hand     Family History  Problem Relation Age of Onset  . Coronary artery disease Mother   . Heart Problems Brother   . Hypertension Brother   . Heart Problems Brother   . Hypertension Brother   . Colon cancer Paternal Uncle   . Lung cancer Father   . 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   Social History   Tobacco Use  . Smoking status: Never Smoker  . Smokeless tobacco: Never Used  Substance Use Topics  . Alcohol use: No    Alcohol/week:  0.0 standard drinks    Current Meds  Medication Sig  . Cholecalciferol (VITAMIN D-3) 125 MCG (5000 UT) TABS Take 1 tablet by mouth daily.  Marland Kitchen estradiol (ESTRACE) 2 MG tablet Take 1 tablet (2 mg total) by mouth daily.  . Melatonin 10 MG CAPS Take 1 mL by mouth daily.   . Misc Natural Products (NF FORMULAS TESTOSTERONE) CAPS Take 15 mg by mouth daily.  . Misc Natural Products (NF FORMULAS TESTOSTERONE) CAPS Take 15 mg by mouth daily.  . NP THYROID 120 MG tablet TAKE 1 TABLET BY MOUTH DAILY.  . progesterone (PROMETRIUM) 200 MG capsule TAKE (1) CAPSULE BY MOUTH AT BEDTIME.       Depression screen [facility] 2/9 01/08/2020  06/21/2019 06/26/2015 05/21/2015 02/05/2015  Decreased Interest 0 0 0 0 0  Down, Depressed, Hopeless 0 0 0 0 0  PHQ - 2 Score 0 0 0 0 0     Objective:   Today's Vitals: BP 120/75 (BP Location: Left Arm, Patient Position: Sitting, Cuff Size: Normal)   Pulse 92   Temp (!) 97.5 F (36.4 C) (Temporal)   Ht 5\' 4"  (1.626 m)   Wt 126 lb 3.2 oz (57.2 kg)   SpO2 97%   BMI 21.66 kg/m  Vitals with BMI 01/08/2020 09/13/2019 06/21/2019  Height 5\' 4"  5\' 4"  5' 4.75"  Weight 126 lbs 3 oz 134 lbs 13 oz 142 lbs  BMI 21.65 123456 [id number]  Systolic 123456 [id number]  Diastolic 75 72 62  Pulse 92 82 59     Physical Exam   She looks systemically well.  She has lost further weight pound since January.  Blood pressure is excellent.  She looks in very good shape.    Assessment   1. Exercise intolerance   2. Primary ovarian failure   3. Acquired hypothyroidism   4. Vitamin D deficiency disease       Tests ordered No orders of the defined types were placed in this encounter.    Plan: 1. Today we discussed her VO2 max in detail and I explained to her the prescription I would give and have given a handout on exactly what I want her to do about 3-4 times a week never on consecutive days.  Together with this, she will continue with strength training. 2. She will continue with all medications as above. 3. I will see her for an annual physical exam at the end of September and we will do blood work at that time. 4. Today I spent 30 minutes with this patient discussing in detail the VO2 max as well as giving her prescription for interval training as well as aerobic training.  We also reviewed all her hormones and blood work related to that.   No orders of the defined types were placed in this encounter.   Doree Albee, MD

## 2020-01-08 NOTE — Patient Instructions (Signed)
INTERVAL TRAINING  Try to do interval training 3 times a week, never on consecutive days. This is the program I want you to follow: 5 minutes warm up to get your heart rate up to 160/min. 30 seconds interval maintaining heart rate 160/min. 1 to 2 minutes recovery, maintaining heart rate above 145/min. 30 seconds interval maintaining heart rate 160/min. 1 to 2-minute recovery, maintaining heart rate above 145/min. 30 seconds interval maintaining heart rate 160/min 1 to 2-minute recovery, maintaining a heart rate above 145/min. 30 seconds interval maintaining heart rate 160/min. 5 minutes cooldown.  As you get better, increase the intervals to 45 seconds each and eventually to 1 minute each. You can have more than 4 intervals, even 6-8 if you wanted to. Alternatively, you could make the intervals longer such as 1 1/2  minutes with 4 intervals.    AEROBIC TRAINING  This is an alternative to interval training above.  I would do this on days that you do not feel like doing interval training.  The program is as follows: 5 minutes warm up to get your heart rate up to 145/min. Maintain this heart rate of 145/min for 15 minutes. 5 minutes cooldown.

## 2020-01-10 ENCOUNTER — Encounter (INDEPENDENT_AMBULATORY_CARE_PROVIDER_SITE_OTHER): Payer: Self-pay | Admitting: Internal Medicine

## 2020-02-07 ENCOUNTER — Other Ambulatory Visit (INDEPENDENT_AMBULATORY_CARE_PROVIDER_SITE_OTHER): Payer: Self-pay | Admitting: Internal Medicine

## 2020-02-19 ENCOUNTER — Other Ambulatory Visit (INDEPENDENT_AMBULATORY_CARE_PROVIDER_SITE_OTHER): Payer: Self-pay | Admitting: Internal Medicine

## 2020-02-19 MED ORDER — ESTRADIOL 2 MG PO TABS: 2.0000 mg | ORAL_TABLET | Freq: Every day | ORAL | 3 refills | Status: DC

## 2020-02-19 NOTE — Telephone Encounter (Signed)
Patient requesting refill of estradiol 2mg  by mouth daily

## 2020-03-03 ENCOUNTER — Other Ambulatory Visit (INDEPENDENT_AMBULATORY_CARE_PROVIDER_SITE_OTHER): Payer: Self-pay | Admitting: Internal Medicine

## 2020-03-11 ENCOUNTER — Other Ambulatory Visit (INDEPENDENT_AMBULATORY_CARE_PROVIDER_SITE_OTHER): Payer: Self-pay | Admitting: Internal Medicine

## 2020-04-07 ENCOUNTER — Other Ambulatory Visit (INDEPENDENT_AMBULATORY_CARE_PROVIDER_SITE_OTHER): Payer: Self-pay | Admitting: Internal Medicine

## 2020-04-29 ENCOUNTER — Encounter (INDEPENDENT_AMBULATORY_CARE_PROVIDER_SITE_OTHER): Payer: Self-pay | Admitting: Internal Medicine

## 2020-04-29 ENCOUNTER — Telehealth (INDEPENDENT_AMBULATORY_CARE_PROVIDER_SITE_OTHER): Payer: Self-pay | Admitting: Internal Medicine

## 2020-04-29 NOTE — Telephone Encounter (Signed)
I have just call them now.  I have sent her a my chart message also.

## 2020-05-13 ENCOUNTER — Other Ambulatory Visit (INDEPENDENT_AMBULATORY_CARE_PROVIDER_SITE_OTHER): Payer: Self-pay | Admitting: Internal Medicine

## 2020-05-15 ENCOUNTER — Other Ambulatory Visit (HOSPITAL_COMMUNITY): Payer: Self-pay | Admitting: Internal Medicine

## 2020-05-15 DIAGNOSIS — Z1231 Encounter for screening mammogram for malignant neoplasm of breast: Secondary | ICD-10-CM

## 2020-05-20 ENCOUNTER — Encounter (INDEPENDENT_AMBULATORY_CARE_PROVIDER_SITE_OTHER): Payer: Self-pay | Admitting: Internal Medicine

## 2020-05-20 ENCOUNTER — Other Ambulatory Visit: Payer: Self-pay

## 2020-05-20 ENCOUNTER — Ambulatory Visit (INDEPENDENT_AMBULATORY_CARE_PROVIDER_SITE_OTHER): Payer: BC Managed Care – PPO | Admitting: Internal Medicine

## 2020-05-20 VITALS — BP 118/67 | HR 75 | Temp 98.1°F | Resp 18 | Ht 64.0 in | Wt 123.0 lb

## 2020-05-20 DIAGNOSIS — E039 Hypothyroidism, unspecified: Secondary | ICD-10-CM

## 2020-05-20 DIAGNOSIS — E2839 Other primary ovarian failure: Secondary | ICD-10-CM | POA: Diagnosis not present

## 2020-05-20 DIAGNOSIS — E559 Vitamin D deficiency, unspecified: Secondary | ICD-10-CM | POA: Diagnosis not present

## 2020-05-20 NOTE — Progress Notes (Signed)
Metrics: Intervention Frequency ACO  Documented Smoking Status Yearly  Screened one or more times in 24 months  Cessation Counseling or  Active cessation medication Past 24 months  Past 24 months   Guideline developer: UpToDate (See UpToDate for funding source) Date Released: 2014       Wellness Office Visit  Subjective:  Patient ID: Leah Cuevas, female    DOB: 1961-05-25  Age: 59 y.o. MRN: [id number]  CC: This lady comes in for follow-up of bioidentical hormone therapy, thyroid hypofunction.  She was scheduled for an annual physical exam but she was not expecting this so we changed the visit to a follow-up visit today. HPI  Overall, she is doing very well.  She continues on estradiol, progesterone and testosterone as well as desiccated NP thyroid.  She has been doing interval training as recommended based on VO2 max testing. Past Medical History:  Diagnosis Date  . Allergy   . Anemia   . Esophagitis   . 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 Hyperplastic polyp of intestine   . Non-celiac gluten sensitivity   . Rotator cuff impingement syndrome   . Thyroid disease   . Vaginal Pap smear, abnormal    Past Surgical History:  Procedure Laterality Date  . APPENDECTOMY    . BACTERIAL OVERGROWTH TEST N/A 02/10/2016   Procedure: BACTERIAL OVERGROWTH TEST;  Surgeon: Danie Binder, MD;  [facility];  Service: Endoscopy;  Laterality: N/A;  0700  . BACTERIAL OVERGROWTH TEST N/A 04/13/2016   Procedure: BACTERIAL OVERGROWTH TEST;  Surgeon: Danie Binder, MD;  [facility];  Service: Endoscopy;  Laterality: N/A;  0700  . CESAREAN SECTION  1998  . COLONOSCOPY  2006 DB   HYPERPLASTIC POLYP  . COLONOSCOPY N/A 09/12/2015   Procedure: COLONOSCOPY;  Surgeon: Danie Binder, MD;  [facility];  Service: Endoscopy;  Laterality: N/A;  1115   . DILATION AND CURETTAGE OF UTERUS  1997   s/p spontaneous abortion  .  ESOPHAGOGASTRODUODENOSCOPY N/A 10/17/2015   Procedure: ESOPHAGOGASTRODUODENOSCOPY (EGD);  Surgeon: Danie Binder, MD;  [facility];  Service: Endoscopy;  Laterality: N/A;  830   . EXPLORATORY LAPROSCOPY     Dx PID  . Tendon reattachment  1999   Right hand     Family History  Problem Relation Age of Onset  . Coronary artery disease Mother   . Heart Problems Brother   . Hypertension Brother   . Heart Problems Brother   . Hypertension Brother   . Colon cancer Paternal Uncle   . Lung cancer Father   . 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   Social History   Tobacco Use  . Smoking status: Never Smoker  . Smokeless tobacco: Never Used  Substance Use Topics  . Alcohol use: No    Alcohol/week: 0.0 standard drinks    Current Meds  Medication Sig  . Cholecalciferol (VITAMIN D-3) 125 MCG (5000 UT) TABS Take 1 tablet by mouth daily.  Marland Kitchen estradiol (ESTRACE) 2 MG tablet TAKE 1 TABLET BY MOUTH ONCE DAILY.  . Melatonin 10 MG CAPS Take 1 mL by mouth daily.   . Misc Natural Products (NF FORMULAS TESTOSTERONE) CAPS Take 15 mg by mouth daily.  . Misc Natural Products (NF FORMULAS TESTOSTERONE) CAPS Take 15 mg by mouth daily.  Marland Kitchen NP  THYROID 120 MG tablet TAKE 1 TABLET BY MOUTH DAILY.  . progesterone (PROMETRIUM) 200 MG capsule TAKE (1) CAPSULE BY MOUTH AT BEDTIME.      Depression screen [facility] 2/9 01/08/2020 06/21/2019 06/26/2015 05/21/2015 02/05/2015  Decreased Interest 0 0 0 0 0  Down, Depressed, Hopeless 0 0 0 0 0  PHQ - 2 Score 0 0 0 0 0     Objective:   Today's Vitals: BP 118/67 (BP Location: Right Arm, Patient Position: Sitting, Cuff Size: Normal)   Pulse 75   Temp 98.1 F (36.7 C) (Temporal)   Resp 18   Ht 5\' 4"  (1.626 m)   Wt 123 lb (55.8 kg)   SpO2 98%   BMI 21.11 kg/m  Vitals with BMI 05/20/2020 01/08/2020 09/13/2019  Height 5\' 4"  5\' 4"  5\' 4"    Weight 123 lbs 126 lbs 3 oz 134 lbs 13 oz  BMI 21.1 40.08 67.61  Systolic 950 932 671  Diastolic 67 75 72  Pulse 75 92 82     Physical Exam  She looks systemically well.  She lost further 3 pounds since the last visit.  Blood pressure is excellent.  No other new physical findings.     Assessment   1. Primary ovarian failure   2. Acquired hypothyroidism   3. Vitamin D deficiency disease       Tests ordered No orders of the defined types were placed in this encounter.    Plan: 1. She will continue with estradiol and progesterone as before as well as testosterone therapy.  Levels previously have been in a good range. 2. She will continue with desiccated NP thyroid as before. 3. She will continue with vitamin D3 supplementation as before. 4. Follow-up in about 3 months at which point we will do an annual physical exam.   No orders of the defined types were placed in this encounter.   Doree Albee, MD

## 2020-06-02 ENCOUNTER — Other Ambulatory Visit: Payer: Self-pay

## 2020-06-02 ENCOUNTER — Ambulatory Visit (HOSPITAL_COMMUNITY)
Admission: RE | Admit: 2020-06-02 | Discharge: 2020-06-02 | Disposition: A | Discharge status: Home / Self Care | Payer: BC Managed Care – PPO | Source: Ambulatory Visit | Attending: Internal Medicine | Admitting: Internal Medicine

## 2020-06-02 DIAGNOSIS — Z1231 Encounter for screening mammogram for malignant neoplasm of breast: Secondary | ICD-10-CM | POA: Insufficient documentation

## 2020-07-11 ENCOUNTER — Other Ambulatory Visit (INDEPENDENT_AMBULATORY_CARE_PROVIDER_SITE_OTHER): Payer: Self-pay | Admitting: Nurse Practitioner

## 2020-08-28 ENCOUNTER — Encounter (INDEPENDENT_AMBULATORY_CARE_PROVIDER_SITE_OTHER): Payer: Self-pay | Admitting: Internal Medicine

## 2020-08-28 ENCOUNTER — Other Ambulatory Visit: Payer: Self-pay

## 2020-08-28 ENCOUNTER — Ambulatory Visit (INDEPENDENT_AMBULATORY_CARE_PROVIDER_SITE_OTHER): Payer: BC Managed Care – PPO | Admitting: Internal Medicine

## 2020-08-28 VITALS — BP 112/74 | HR 57 | Temp 97.3°F | Ht 63.5 in | Wt 121.8 lb

## 2020-08-28 DIAGNOSIS — M25562 Pain in left knee: Secondary | ICD-10-CM

## 2020-08-28 DIAGNOSIS — H9313 Tinnitus, bilateral: Secondary | ICD-10-CM | POA: Diagnosis not present

## 2020-08-28 DIAGNOSIS — E2839 Other primary ovarian failure: Secondary | ICD-10-CM | POA: Diagnosis not present

## 2020-08-28 DIAGNOSIS — E039 Hypothyroidism, unspecified: Secondary | ICD-10-CM

## 2020-08-28 DIAGNOSIS — Z0001 Encounter for general adult medical examination with abnormal findings: Secondary | ICD-10-CM | POA: Diagnosis not present

## 2020-08-28 DIAGNOSIS — G8929 Other chronic pain: Secondary | ICD-10-CM

## 2020-08-28 DIAGNOSIS — M25561 Pain in right knee: Secondary | ICD-10-CM

## 2020-08-28 DIAGNOSIS — E559 Vitamin D deficiency, unspecified: Secondary | ICD-10-CM | POA: Diagnosis not present

## 2020-08-28 DIAGNOSIS — Z13220 Encounter for screening for lipoid disorders: Secondary | ICD-10-CM

## 2020-08-28 NOTE — Progress Notes (Signed)
Chief Complaint: This 60 year old lady comes in for an annual physical exam and to address her chronic conditions which are described below. HPI: She feels reasonably well but she has a few complaints today. She is complaining of bilateral knee pain, typically worse when she tries to do squats which she does at least once a week. She is also complaining of bilateral tinnitus again seems to be worse on the left. She continues on desiccated NP thyroid for hypothyroidism. She continues on testosterone therapy for wellness and improvement of some symptoms that she had previously.  She continues on estradiol and progesterone for menopausal symptoms and wellness and prevention of chronic disease.  Past Medical History:  Diagnosis Date  . Allergy   . Anemia   . Esophagitis   . 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 Hyperplastic polyp of intestine   . Non-celiac gluten sensitivity   . Rotator cuff impingement syndrome   . Thyroid disease   . Vaginal Pap smear, abnormal    Past Surgical History:  Procedure Laterality Date  . APPENDECTOMY    . BACTERIAL OVERGROWTH TEST N/A 02/10/2016   Procedure: BACTERIAL OVERGROWTH TEST;  Surgeon: Danie Binder, MD;  [facility];  Service: Endoscopy;  Laterality: N/A;  0700  . BACTERIAL OVERGROWTH TEST N/A 04/13/2016   Procedure: BACTERIAL OVERGROWTH TEST;  Surgeon: Danie Binder, MD;  [facility];  Service: Endoscopy;  Laterality: N/A;  0700  . CESAREAN SECTION  1998  . COLONOSCOPY  2006 DB   HYPERPLASTIC POLYP  . COLONOSCOPY N/A 09/12/2015   Procedure: COLONOSCOPY;  Surgeon: Danie Binder, MD;  [facility];  Service: Endoscopy;  Laterality: N/A;  1115   . DILATION AND CURETTAGE OF UTERUS  1997   s/p spontaneous abortion  . ESOPHAGOGASTRODUODENOSCOPY N/A 10/17/2015   Procedure: ESOPHAGOGASTRODUODENOSCOPY (EGD);  Surgeon: Danie Binder, MD;  [facility];  Service: Endoscopy;   Laterality: N/A;  830   . EXPLORATORY LAPROSCOPY     Dx PID  . 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    Social History   Tobacco Use  . Smoking status: Never Smoker  . Smokeless tobacco: Never Used  Substance Use Topics  . Alcohol use: No    Alcohol/week: 0.0 standard drinks      Allergies:  Allergies  Allergen Reactions  . Penicillins     REACTION: Vomiting. No rashes or trouble breathing. Has patient had a PCN reaction causing immediate rash, facial/tongue/throat swelling, SOB or lightheadedness with hypotension: No Has patient had a PCN reaction causing severe rash involving mucus membranes or skin necrosis: No Has patient had a PCN reaction that required hospitalization No Has patient had a PCN reaction occurring within the last 10 years: No If all of the above answers are "NO", then may proceed with Cephalosporin use.      Current Meds  Medication Sig  . Cholecalciferol (VITAMIN D-3) 125 MCG (5000 UT) TABS Take 1 tablet by mouth daily.  Marland Kitchen estradiol (ESTRACE) 2 MG tablet TAKE 1 TABLET BY MOUTH ONCE DAILY.  . Melatonin 10 MG CAPS Take 1 mL by mouth daily.   . Misc Natural Products (NF FORMULAS TESTOSTERONE) CAPS Take 15 mg by mouth daily.  . NP THYROID 120 MG tablet TAKE 1 TABLET BY MOUTH DAILY.  Marland Kitchen  progesterone (PROMETRIUM) 200 MG capsule TAKE (1) CAPSULE BY MOUTH AT BEDTIME.  Marland Kitchen Zinc 50 MG CAPS Take 1 capsule by mouth daily.       Depression screen [facility] 2/9 08/28/2020 01/08/2020 06/21/2019 06/26/2015 05/21/2015  Decreased Interest 0 0 0 0 0  Down, Depressed, Hopeless 0 0 0 0 0  PHQ - 2 Score 0 0 0 0 0  Altered sleeping 0 - - - -  Tired, decreased energy 0 - - - -  Change in appetite 0 - - - -  Feeling bad or failure about yourself  0 - - - -  Trouble concentrating 0 - - - -  Moving slowly or fidgety/restless 0 - - - -  Suicidal  thoughts 0 - - - -  PHQ-9 Score 0 - - - -  Difficult doing work/chores Not difficult at all - - - -     [id number] from the symptoms mentioned above,there are no other symptoms referable to all systems reviewed.       Physical Exam: Blood pressure 112/74, pulse (!) 57, temperature (!) 97.3 F (36.3 C), temperature source Temporal, height 5' 3.5" (1.613 m), weight 121 lb 12.8 oz (55.2 kg), SpO2 97 %. Vitals with BMI 08/28/2020 05/20/2020 01/08/2020  Height 5' 3.5" 5\' 4"  5\' 4"   Weight 121 lbs 13 oz 123 lbs 126 lbs 3 oz  BMI 21.23 50.0 93.81  Systolic 829 937 169  Diastolic 74 67 75  Pulse 57 75 92      She looks systemically well.  Weight is very healthy and stable. General: Alert, cooperative, and appears to be the stated age.No pallor.  No jaundice.  No clubbing. Head: Normocephalic Eyes: Sclera white, pupils equal and reactive to light, red reflex x 2,  Ears: Normal bilaterally Oral cavity: Lips, mucosa, and tongue normal: Teeth and gums normal Neck: No adenopathy, supple, symmetrical, trachea midline, and thyroid does not appear enlarged. Breast: No masses felt. Respiratory: Clear to auscultation bilaterally.No wheezing, crackles or bronchial breathing. Cardiovascular: Heart sounds are present and appear to be normal without murmurs or added sounds.  No carotid bruits.  Peripheral pulses are present and equal bilaterally.: Gastrointestinal:positive bowel sounds, no hepatosplenomegaly.  No masses felt.No tenderness. Skin: Clear, No rashes noted.No worrisome skin lesions seen. Neurological: Grossly intact without focal findings, cranial nerves II through XII intact, muscle strength equal bilaterally Musculoskeletal: No acute joint abnormalities noted.Full range of movement noted with joints. Psychiatric: Affect appropriate, non-anxious.    Assessment  1. Encounter for general adult medical examination with abnormal findings   2. Acquired hypothyroidism   3. Primary ovarian  failure   4. Chronic pain of both knees   5. Tinnitus of both ears   6. Vitamin D deficiency disease   7. Screening for lipoid disorders     Tests Ordered:   Orders Placed This Encounter  Procedures  . CBC  . COMPLETE METABOLIC PANEL WITH GFR  . Estradiol  . Progesterone  . Lipid panel  . T3, free  . TSH  . Testos,Total,Free and SHBG (Female)  . VITAMIN D 25 Hydroxy (Vit-D Deficiency, Fractures)  . Ambulatory referral to ENT  . Ambulatory referral to Orthopedic Surgery     Plan  1. Overall fairly healthy 60 year old lady. 2. I will refer to orthopedics for chronic knee pains. 3. I will refer to ENT for her tinnitus which she is not sure if it is pulsatile or not. 4. She will continue with desiccated NP thyroid for hypothyroidism  and we will check levels today. 5. She will continue with estradiol, progesterone and testosterone therapy and we will check levels today. 6. Further recommendations will depend on all these results and I will see her in 6 months time for follow-up.  I did recommend that she get COVID-19 booster but she is not keen on this. 7. Today, in addition to a preventative visit, I performed an office visit to address all of her chronic conditions above and symptoms above.     No orders of the defined types were placed in this encounter.    Thorsten Climer C Dericka Ostenson   08/28/2020, 9:30 AM

## 2020-09-02 LAB — COMPLETE METABOLIC PANEL WITH GFR
AG Ratio: 1.6 (calc) (ref 1.0–2.5)
ALT: 10 U/L (ref 6–29)
AST: 18 U/L (ref 10–35)
Albumin: 4.2 g/dL (ref 3.6–5.1)
Alkaline phosphatase (APISO): 55 U/L (ref 37–153)
BUN: 23 mg/dL (ref 7–25)
CO2: 26 mmol/L (ref 20–32)
Calcium: 9 mg/dL (ref 8.6–10.4)
Chloride: 105 mmol/L (ref 98–110)
Creat: 0.82 mg/dL (ref 0.50–1.05)
GFR, Est African American: 91 mL/min/{1.73_m2} (ref >=60)
GFR, Est Non African American: 79 mL/min/{1.73_m2} (ref >=60)
Globulin: 2.6 g/dL (calc) (ref 1.9–3.7)
Glucose, Bld: 82 mg/dL (ref 65–99)
Potassium: 3.8 mmol/L (ref 3.5–5.3)
Sodium: 140 mmol/L (ref 135–146)
Total Bilirubin: 0.8 mg/dL (ref 0.2–1.2)
Total Protein: 6.8 g/dL (ref 6.1–8.1)

## 2020-09-02 LAB — VITAMIN D 25 HYDROXY (VIT D DEFICIENCY, FRACTURES): Vit D, 25-Hydroxy: 82 ng/mL (ref 30–100)

## 2020-09-02 LAB — ESTRADIOL: Estradiol: 60 pg/mL

## 2020-09-02 LAB — CBC
HCT: 39.2 % (ref 35.0–45.0)
Hemoglobin: 12.7 g/dL (ref 11.7–15.5)
MCH: 25.5 pg — ABNORMAL LOW (ref 27.0–33.0)
MCHC: 32.4 g/dL (ref 32.0–36.0)
MCV: 78.6 fL — ABNORMAL LOW (ref 80.0–100.0)
MPV: 11.4 fL (ref 7.5–12.5)
Platelets: 205 10*3/uL (ref 140–400)
RBC: 4.99 10*6/uL (ref 3.80–5.10)
RDW: 13.4 % (ref 11.0–15.0)
WBC: 6.4 10*3/uL (ref 3.8–10.8)

## 2020-09-02 LAB — TESTOS,TOTAL,FREE AND SHBG (FEMALE)
Free Testosterone: 19 pg/mL — ABNORMAL HIGH (ref 0.1–6.4)
Sex Hormone Binding: 126 nmol/L — ABNORMAL HIGH (ref 14–73)
Testosterone, Total, LC-MS-MS: 162 ng/dL — ABNORMAL HIGH (ref 2–45)

## 2020-09-02 LAB — T3, FREE: T3, Free: 4.9 pg/mL — ABNORMAL HIGH (ref 2.3–4.2)

## 2020-09-02 LAB — PROGESTERONE: Progesterone: 11.1 ng/mL

## 2020-09-02 LAB — TSH: TSH: 1.61 mIU/L (ref 0.40–4.50)

## 2020-09-03 ENCOUNTER — Encounter (INDEPENDENT_AMBULATORY_CARE_PROVIDER_SITE_OTHER): Payer: Self-pay | Admitting: Internal Medicine

## 2020-09-09 ENCOUNTER — Encounter: Payer: Self-pay | Admitting: Internal Medicine

## 2020-09-15 ENCOUNTER — Ambulatory Visit (INDEPENDENT_AMBULATORY_CARE_PROVIDER_SITE_OTHER): Payer: BC Managed Care – PPO | Admitting: Orthopedic Surgery

## 2020-09-15 ENCOUNTER — Encounter: Payer: Self-pay | Admitting: Orthopedic Surgery

## 2020-09-15 ENCOUNTER — Ambulatory Visit: Payer: BC Managed Care – PPO

## 2020-09-15 ENCOUNTER — Other Ambulatory Visit: Payer: Self-pay

## 2020-09-15 VITALS — BP 142/92 | HR 93 | Ht 63.75 in | Wt 120.0 lb

## 2020-09-15 DIAGNOSIS — M25562 Pain in left knee: Secondary | ICD-10-CM | POA: Diagnosis not present

## 2020-09-15 DIAGNOSIS — M25561 Pain in right knee: Secondary | ICD-10-CM

## 2020-09-15 DIAGNOSIS — M170 Bilateral primary osteoarthritis of knee: Secondary | ICD-10-CM | POA: Diagnosis not present

## 2020-09-15 DIAGNOSIS — G8929 Other chronic pain: Secondary | ICD-10-CM | POA: Diagnosis not present

## 2020-09-15 NOTE — Progress Notes (Signed)
NEW PROBLEM//OFFICE VISIT  Summary assessment and plan:  60 year old female with activity related anterior knee pain left greater than right  No surgical intervention at this time she has reasonable joint space and alignment.  We recommend activity modification    Chief Complaint  Patient presents with  . Knee Pain    Bilateral since fall 10 years ago     60 year old female bilateral knee pain left greater than right seems to have started about 10 years ago when she fell on the stairs injuring both knees with pain in the front of the knee.  She now complains of swelling intermittently in the left knee with anterior knee pain and popping this is exacerbated by lunges and squats  Her normal activities of daily living she does not experience any pain     Review of Systems  All other systems reviewed and are negative.    Past Medical History:  Diagnosis Date  . Allergy   . Anemia   . Esophagitis   . 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 Hyperplastic polyp of intestine   . Non-celiac gluten sensitivity   . Rotator cuff impingement syndrome   . Thyroid disease   . Vaginal Pap smear, abnormal     Past Surgical History:  Procedure Laterality Date  . APPENDECTOMY    . BACTERIAL OVERGROWTH TEST N/A 02/10/2016   Procedure: BACTERIAL OVERGROWTH TEST;  Surgeon: Danie Binder, MD;  [facility];  Service: Endoscopy;  Laterality: N/A;  0700  . BACTERIAL OVERGROWTH TEST N/A 04/13/2016   Procedure: BACTERIAL OVERGROWTH TEST;  Surgeon: Danie Binder, MD;  Location: [facility];  Service: Endoscopy;  Laterality: N/A;  0700  . CESAREAN SECTION  1998  . COLONOSCOPY  2006 DB   HYPERPLASTIC POLYP  . COLONOSCOPY N/A 09/12/2015   Procedure: COLONOSCOPY;  Surgeon: Danie Binder, MD;  [facility]: [facility];  Service: Endoscopy;  Laterality: N/A;  1115   . DILATION AND CURETTAGE OF UTERUS  1997   s/p spontaneous abortion  . ESOPHAGOGASTRODUODENOSCOPY  N/A 10/17/2015   Procedure: ESOPHAGOGASTRODUODENOSCOPY (EGD);  Surgeon: Danie Binder, MD;  [facility]: [facility];  Service: Endoscopy;  Laterality: N/A;  830   . EXPLORATORY LAPROSCOPY     Dx PID  . Tendon reattachment  1999   Right hand    Family History  Problem Relation Age of Onset  . Coronary artery disease Mother   . Heart Problems Brother   . Hypertension Brother   . Heart Problems Brother   . Hypertension Brother   . Colon cancer Paternal Uncle   . Lung cancer Father   . Coronary artery disease Sister    Social History   Tobacco Use  . Smoking status: Never Smoker  . Smokeless tobacco: Never Used  Vaping Use  . Vaping Use: Never used  Substance Use Topics  . Alcohol use: No    Alcohol/week: 0.0 standard drinks  . Drug use: No    Allergies  Allergen Reactions  . Penicillins     REACTION: Vomiting. No rashes or trouble breathing. Has patient had a PCN reaction causing immediate rash, facial/tongue/throat swelling, SOB or lightheadedness with hypotension: No Has patient had a PCN reaction causing severe rash involving mucus membranes or skin necrosis: No Has patient had a PCN reaction that required hospitalization No Has patient had a PCN reaction occurring within the last 10 years: No If all of the above answers are "NO", then  may proceed with Cephalosporin use.     Current Meds  Medication Sig  . Cholecalciferol (VITAMIN D-3) 125 MCG (5000 UT) TABS Take 1 tablet by mouth daily.  . Melatonin 10 MG CAPS Take 1 mL by mouth daily.   . Misc Natural Products (NF FORMULAS TESTOSTERONE) CAPS Take 15 mg by mouth daily.  . NP THYROID 120 MG tablet TAKE 1 TABLET BY MOUTH DAILY.  . progesterone (PROMETRIUM) 200 MG capsule TAKE (1) CAPSULE BY MOUTH AT BEDTIME.  Marland Kitchen Zinc 50 MG CAPS Take 1 capsule by mouth daily.    BP (!) 142/92   Pulse 93   Ht 5' 3.75" (1.619 m)   Wt 120 lb (54.4 kg)   BMI 20.76 kg/m   Physical Exam Vitals and nursing note reviewed.   Constitutional:      Appearance: Normal appearance.  Musculoskeletal:        General: Normal range of motion.     Comments: Bilateral knee examination Alignment is normal No tenderness or swelling Skin is normal Range of motion is normal Both knees are stable Muscle tone is normal  Skin:    General: Skin is warm.     Capillary Refill: Capillary refill takes less than 2 seconds.  Neurological:     General: No focal deficit present.     Mental Status: She is alert and oriented to person, place, and time.     Gait: Gait normal.  Psychiatric:        Mood and Affect: Mood normal.         MEDICAL DECISION MAKING  A.  Encounter Diagnoses  Name Primary?  . Chronic pain of left knee Yes  . Chronic pain of right knee     B. DATA ANALYSED:   IMAGING: Interpretation of images: internal  Orders: None  Outside records reviewed: None   C. MANAGEMENT   Activity modification patient education  No orders of the defined types were placed in this encounter.     Arther Abbott, MD  09/15/2020 9:22 AM

## 2020-09-15 NOTE — Patient Instructions (Addendum)
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 It refers to joint pain or joint disease. Osteoarthritis affects tissue that covers the ends of bones in joints (cartilage). Cartilage acts as a cushion between the bones and helps them move smoothly. Osteoarthritis occurs when cartilage in the joints gets worn down. Osteoarthritis is sometimes called "wear and tear" arthritis. Osteoarthritis is the most common form of arthritis. It often occurs in older people. It is a condition that gets worse over time. The joints most often affected by this condition are in the fingers, toes, hips, knees, and spine, including the neck and lower back. What are the causes? This condition is caused by the wearing down of cartilage that covers the ends of bones. What increases the risk? The following factors may make you more likely to develop this condition:  Being age 58 or older.  Obesity.  Overuse of joints.  Past injury of a joint.  Past surgery on a joint.  Family history of osteoarthritis. What are the signs or symptoms? The main symptoms of this condition are pain, swelling, and stiffness in the joint. Other symptoms may include:  An enlarged joint.  More pain and further damage caused by small pieces of bone or cartilage that break off and float inside of the joint.  Small deposits of bone (osteophytes) that grow on the edges of the joint.  A grating or scraping feeling inside the joint when you move it.  Popping or creaking sounds when you move.  Difficulty walking or exercising.  An inability to grip items, twist your hand(s), or control the movements of your hands and fingers. How is this diagnosed? This condition may be diagnosed based on:  Your medical history.  A physical exam.  Your symptoms.  X-rays of the affected joint(s).  Blood tests to  rule out other types of arthritis. How is this treated? There is no cure for this condition, but treatment can help control pain and improve joint function. Treatment may include a combination of therapies, such as:  Pain relief techniques, such as: ? Applying heat and cold to the joint. ? Massage. ? A form of talk therapy called cognitive behavioral therapy (CBT). This therapy helps you set goals and follow up on the changes that you make.  Medicines for pain and inflammation. The medicines can be taken by mouth or applied to the skin. They include: ? NSAIDs, such as ibuprofen. ? Prescription medicines. ? Strong anti-inflammatory medicines (corticosteroids). ? Certain nutritional supplements.  A prescribed exercise program. You may work with a physical therapist.  Assistive devices, such as a brace, wrap, splint, specialized glove, or cane.  A weight control plan.  Surgery, such as: ? An osteotomy. This is done to reposition the bones and relieve pain or to remove loose pieces of bone and cartilage. ? Joint replacement surgery. You may need this surgery if you have advanced osteoarthritis. Follow these instructions at home: Activity  Rest your affected joints as told by your health care provider.  Exercise as told by your health care provider. He or she may recommend specific types of exercise, such as: ? Strengthening exercises. These are done to strengthen the muscles that support joints affected by arthritis. ? Aerobic activities. These are exercises, such as brisk walking or water aerobics, that increase your heart rate. ? Range-of-motion activities. These help your joints move more easily. ?  Balance and agility exercises. Managing pain, stiffness, and swelling  If directed, apply heat to the affected area as often as told by your health care provider. Use the heat source that your health care provider recommends, such as a moist heat pack or a heating pad. ? If you have a  removable assistive device, remove it as told by your health care provider. ? Place a towel between your skin and the heat source. If your health care provider tells you to keep the assistive device on while you apply heat, place a towel between the assistive device and the heat source. ? Leave the heat on for 20-30 minutes. ? Remove the heat if your skin turns bright red. This is especially important if you are unable to feel pain, heat, or cold. You may have a greater risk of getting burned.  If directed, put ice on the affected area. To do this: ? If you have a removable assistive device, remove it as told by your health care provider. ? Put ice in a plastic bag. ? Place a towel between your skin and the bag. If your health care provider tells you to keep the assistive device on during icing, place a towel between the assistive device and the bag. ? Leave the ice on for 20 minutes, 2-3 times a day. ? Move your fingers or toes often to reduce stiffness and swelling. ? Raise (elevate) the injured area above the level of your heart while you are sitting or lying down.      General instructions  Take over-the-counter and prescription medicines only as told by your health care provider.  Maintain a healthy weight. Follow instructions from your health care provider for weight control.  Do not use any products that contain nicotine or tobacco, such as cigarettes, e-cigarettes, and chewing tobacco. If you need help quitting, ask your health care provider.  Use assistive devices as told by your health care provider.  Keep all follow-up visits as told by your health care provider. This is important. Where to find more information  [employer] of Arthritis and Musculoskeletal and Skin Diseases: www.niams.SouthExposed.es  [employer] on Aging: http://kim-miller.com/  American College of Rheumatology: www.rheumatology.org Contact a health care provider if:  You have redness, swelling, or a  feeling of warmth in a joint that gets worse.  You have a fever along with joint or muscle aches.  You develop a rash.  You have trouble doing your normal activities. Get help right away if:  You have pain that gets worse and is not relieved by pain medicine. Summary  Osteoarthritis is a type of arthritis that affects tissue covering the ends of bones in joints (cartilage).  This condition is caused by the wearing down of cartilage that covers the ends of bones.  The main symptom of this condition is pain, swelling, and stiffness in the joint.  There is no cure for this condition, but treatment can help control pain and improve joint function. This information is not intended to replace advice given to you by your health care provider. Make sure you discuss any questions you have with your health care provider. Document Revised: 07/30/2019 Document Reviewed: 07/30/2019 Elsevier Patient Education  2021 [employer].

## 2020-09-17 ENCOUNTER — Encounter (INDEPENDENT_AMBULATORY_CARE_PROVIDER_SITE_OTHER): Payer: Self-pay | Admitting: Otolaryngology

## 2020-09-17 ENCOUNTER — Other Ambulatory Visit: Payer: Self-pay

## 2020-09-17 ENCOUNTER — Ambulatory Visit (INDEPENDENT_AMBULATORY_CARE_PROVIDER_SITE_OTHER): Payer: BC Managed Care – PPO | Admitting: Otolaryngology

## 2020-09-17 VITALS — Temp 97.3°F

## 2020-09-17 DIAGNOSIS — H6121 Impacted cerumen, right ear: Secondary | ICD-10-CM

## 2020-09-17 DIAGNOSIS — H903 Sensorineural hearing loss, bilateral: Secondary | ICD-10-CM | POA: Diagnosis not present

## 2020-09-17 DIAGNOSIS — H9313 Tinnitus, bilateral: Secondary | ICD-10-CM

## 2020-09-17 NOTE — Progress Notes (Signed)
HPI: Leah Cuevas is a 60 y.o. female who presents is referred by her PCP Dr. Anastasio Champion for evaluation of ringing in her ears that she has noted for the past year. It has gotten worse over the past month. She also feels like the left ear is clogged and will pop sometimes. She has had no drainage from the ears. She has had some itching in the right ear. She felt like the ringing in the ears started shortly after receiving her first vaccine for Covid back in May of last year..  Past Medical History:  Diagnosis Date  . Allergy   . Anemia   . Esophagitis   . 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 Hyperplastic polyp of intestine   . Non-celiac gluten sensitivity   . Rotator cuff impingement syndrome   . Thyroid disease   . Vaginal Pap smear, abnormal    Past Surgical History:  Procedure Laterality Date  . APPENDECTOMY    . BACTERIAL OVERGROWTH TEST N/A 02/10/2016   Procedure: BACTERIAL OVERGROWTH TEST;  Surgeon: Danie Binder, MD;  [facility];  Service: Endoscopy;  Laterality: N/A;  0700  . BACTERIAL OVERGROWTH TEST N/A 04/13/2016   Procedure: BACTERIAL OVERGROWTH TEST;  Surgeon: Danie Binder, MD;  [facility];  Service: Endoscopy;  Laterality: N/A;  0700  . CESAREAN SECTION  1998  . COLONOSCOPY  2006 DB   HYPERPLASTIC POLYP  . COLONOSCOPY N/A 09/12/2015   Procedure: COLONOSCOPY;  Surgeon: Danie Binder, MD;  [facility];  Service: Endoscopy;  Laterality: N/A;  1115   . DILATION AND CURETTAGE OF UTERUS  1997   s/p spontaneous abortion  . ESOPHAGOGASTRODUODENOSCOPY N/A 10/17/2015   Procedure: ESOPHAGOGASTRODUODENOSCOPY (EGD);  Surgeon: Danie Binder, MD;  [facility];  Service: Endoscopy;  Laterality: N/A;  830   . EXPLORATORY LAPROSCOPY     Dx PID  . Tendon reattachment  1999   Right hand   Social History   Socioeconomic History  . Marital status: Married    Spouse name: Not on file  . Number of children:  Not on file  . Years of education: Not on file  . Highest education level: Not on file  Occupational History  . Not on file  Tobacco Use  . Smoking status: Never Smoker  . Smokeless tobacco: Never Used  Vaping Use  . Vaping Use: Never used  Substance and Sexual Activity  . Alcohol use: No    Alcohol/week: 0.0 standard drinks  . Drug use: No  . Sexual activity: Yes    Birth control/protection: Post-menopausal  Other Topics Concern  . 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   Social Determinants of Health   Financial Resource Strain: Not on file  Food Insecurity: Not on file  Transportation Needs: Not on file  Physical Activity: Not on file  Stress: Not on file  Social Connections: Not on file   Family History  Problem Relation Age of Onset  . Coronary artery disease Mother   . Heart Problems Brother   . Hypertension Brother   . Heart Problems Brother   . Hypertension Brother   . Colon cancer Paternal Uncle   . Lung cancer Father   . Coronary artery disease Sister    Allergies  Allergen Reactions  . Penicillins     REACTION: Vomiting. No rashes or trouble breathing. Has  patient had a PCN reaction causing immediate rash, facial/tongue/throat swelling, SOB or lightheadedness with hypotension: No Has patient had a PCN reaction causing severe rash involving mucus membranes or skin necrosis: No Has patient had a PCN reaction that required hospitalization No Has patient had a PCN reaction occurring within the last 10 years: No If all of the above answers are "NO", then may proceed with Cephalosporin use.    Prior to Admission medications   Medication Sig Start Date End Date Taking? Authorizing Provider  Cholecalciferol (VITAMIN D-3) 125 MCG (5000 UT) TABS Take 1 tablet by mouth daily.    [provider]  estradiol (ESTRACE) 2 MG tablet TAKE 1 TABLET BY MOUTH ONCE DAILY. 07/11/20  08/10/20  Doree Albee, MD  Melatonin 10 MG CAPS Take 1 mL by mouth daily.     [provider]  Misc Natural Products (NF FORMULAS TESTOSTERONE) CAPS Take 15 mg by mouth daily. 05/21/19   Doree Albee, MD  NP THYROID 120 MG tablet TAKE 1 TABLET BY MOUTH DAILY. 04/07/20   Doree Albee, MD  progesterone (PROMETRIUM) 200 MG capsule TAKE (1) CAPSULE BY MOUTH AT BEDTIME. 03/03/20   Hurshel Party C, MD  Zinc 50 MG CAPS Take 1 capsule by mouth daily.    [provider]     Positive ROS: Otherwise negative  All other systems have been reviewed and were otherwise negative with the exception of those mentioned in the HPI and as above.  Physical Exam: Constitutional: Alert, well-appearing, no acute distress Ears: External ears without lesions or tenderness. She had wax buildup in the right ear canal that was removed with a curette. Left ear canal was clear. TMs were clear bilaterally with good mobility on pneumatic otoscopy. Nasal: External nose without lesions. Septum with minimal deformity and mild rhinitis. Both middle meatus regions were clear.. Clear nasal passages otherwise. Oral: Lips and gums without lesions. Tongue and palate mucosa without lesions. Posterior oropharynx clear. Neck: No palpable adenopathy or masses Respiratory: Breathing comfortably  Skin: No facial/neck lesions or rash noted.  Audiogram demonstrated a mild downsloping sensorineural hearing loss in both ears above 2000 frequency slightly worse on the left side. SRT's were within normal limits with SRT on the right side at 15 dB and 20 dB on the left side. She had type A tympanograms bilaterally.  Cerumen impaction removal  Date/Time: 09/17/2020 12:12 PM Performed by: Rozetta Nunnery, MD Authorized by: Rozetta Nunnery, MD   Consent:    Consent obtained:  Verbal   Consent given by:  Patient   Risks discussed:  Pain and bleeding Procedure details:    Location:  R ear    Procedure type: curette   Post-procedure details:    Inspection:  TM intact and canal normal   Hearing quality:  Improved   Patient tolerance of procedure:  Tolerated well, no immediate complications Comments:     She had a large clump of wax in the right ear canal that was removed with a curette. TMs are otherwise clear bilaterally. Both ear canals are clear otherwise.    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  Plan: Reviewed with patient concerning treatment of tinnitus which is limited. Discussed using masking noise to help with the tinnitus. Also discussed with her concerning using ear protection when around loud noise. Reviewed the audiologic testing with her that showed essentially normal hearing in the mid  and lower frequencies. Perhaps in several years she may have progressive hearing loss to warrant hearing aids but at this point hearing is considered within normal limits.   Radene Journey, MD   CC:

## 2020-09-22 ENCOUNTER — Encounter (INDEPENDENT_AMBULATORY_CARE_PROVIDER_SITE_OTHER): Payer: Self-pay

## 2020-10-01 ENCOUNTER — Other Ambulatory Visit (INDEPENDENT_AMBULATORY_CARE_PROVIDER_SITE_OTHER): Payer: Self-pay | Admitting: Internal Medicine

## 2020-10-28 ENCOUNTER — Other Ambulatory Visit (INDEPENDENT_AMBULATORY_CARE_PROVIDER_SITE_OTHER): Payer: Self-pay | Admitting: Internal Medicine

## 2020-11-03 ENCOUNTER — Telehealth (INDEPENDENT_AMBULATORY_CARE_PROVIDER_SITE_OTHER): Payer: Self-pay

## 2020-11-03 NOTE — Telephone Encounter (Signed)
Dr. Anastasio Champion called this in to the pharmacy.

## 2020-11-03 NOTE — Telephone Encounter (Signed)
Received a fax from [facility] for a request of the following medication:  Misc Natural Products (NF FORMULAS TESTOSTERONE) CAPS  15 mg capsule Last filled 05/21/2019, # 30 with 2 refills  Last OV 08/28/2020  Next OV 03/02/2021

## 2020-11-11 ENCOUNTER — Other Ambulatory Visit (INDEPENDENT_AMBULATORY_CARE_PROVIDER_SITE_OTHER): Payer: Self-pay | Admitting: Internal Medicine

## 2020-11-18 ENCOUNTER — Telehealth (INDEPENDENT_AMBULATORY_CARE_PROVIDER_SITE_OTHER): Payer: Self-pay

## 2020-11-18 NOTE — Telephone Encounter (Signed)
Patient left a VM returning a call to [facility].

## 2020-11-20 ENCOUNTER — Other Ambulatory Visit: Payer: Self-pay

## 2020-11-20 ENCOUNTER — Ambulatory Visit (INDEPENDENT_AMBULATORY_CARE_PROVIDER_SITE_OTHER): Payer: BC Managed Care – PPO | Admitting: Nurse Practitioner

## 2020-11-20 ENCOUNTER — Encounter: Payer: Self-pay | Admitting: Nurse Practitioner

## 2020-11-20 VITALS — BP 114/68 | HR 68 | Temp 97.1°F | Ht 64.0 in | Wt 123.2 lb

## 2020-11-20 DIAGNOSIS — Z8619 Personal history of other infectious and parasitic diseases: Secondary | ICD-10-CM | POA: Diagnosis not present

## 2020-11-20 DIAGNOSIS — K635 Polyp of colon: Secondary | ICD-10-CM

## 2020-11-20 DIAGNOSIS — K219 Gastro-esophageal reflux disease without esophagitis: Secondary | ICD-10-CM

## 2020-11-20 NOTE — Patient Instructions (Signed)
Your health issues we discussed today were:   History of GERD (reflux/heartburn) and history of H. pylori stomach infection: 1. I am glad your symptoms are doing well! 2. Call us if you have any worsening symptoms and we can make recommendations  Previous colon polyps: 1. As we discussed, you will next be due for your colonoscopy in 2024 2. Call us if he has any concerning symptoms such as unintentional weight loss, rectal bleeding, worsening abdominal pain  Overall I recommend:  1. Continue other current medications 2. Return for follow-up as needed for any GI problems 3. Call us for any questions or concerns   ---------------------------------------------------------------  I am glad you have gotten your COVID-19 vaccination!  Even though you are fully vaccinated you should continue to follow CDC and state/local guidelines.  ---------------------------------------------------------------   At [facility] we value your feedback. You may receive a survey about your visit today. Please share your experience as we strive to create trusting relationships with our patients to provide genuine, compassionate, quality care.  We appreciate your understanding and patience as we review any laboratory studies, imaging, and other diagnostic tests that are ordered as we care for you. Our office policy is 5 business days for review of these results, and any emergent or urgent results are addressed in a timely manner for your best interest. If you do not hear from our office in 1 week, please contact us.   We also encourage the use of MyChart, which contains your medical information for your review as well. If you are not enrolled in this feature, an access code is on this after visit summary for your convenience. Thank you for allowing [place] to be involved in your care.  It was great to see you today!  I hope you have a great Spring!!

## 2020-11-20 NOTE — Progress Notes (Signed)
Referring Provider: Doree Albee, MD Primary Care Physician:  Doree Albee, MD Primary GI:  Dr. Abbey Chatters  Chief Complaint  Patient presents with  . Gastroesophageal Reflux    Doing ok    HPI:   Leah Cuevas is a 60 y.o. female who presents for "follow-up".  Patient was last seen in our office 06/01/2016 for H. pylori infection, GERD, SIBO.  At that time noted history of SIBO testing.  History of bloating, hiatal hernia, occasional GERD.  She uses ginger chews and FD guard.  She did not take Protonix with Pylera because "I do not think it was a good idea."  Recent gastric biopsy H. pylori positive and recommended follow-up HBT for eradication.  Her HPT revealed SIBO and she was given Cipro and Flagyl for 5 days.  Follow-up for H. pylori eradication came back negative.  At her last visit she finished all her antibiotics.  GERD symptoms resolved, she thinks drinking apple cider vinegar helps a lot.  Bloating and belching much improved.  No other overt GI complaints.  Recommended continue current medications, follow-up as needed.  Today she states she is doing okay overall. Denies abdominal pain, nausea, vomiting, hematochezia, melena, fever, chills, unintentional weight loss. Just has occasional belching, which is tolerable. Denies URI or flu-like symptoms. Denies loss of sense of taste or smell. The patient has received COVID-19 vaccination(s). Denies chest pain, dyspnea, dizziness, lightheadedness, syncope, near syncope. Denies any other upper or lower GI symptoms.  Past Medical History:  Diagnosis Date  . Allergy   . Anemia   . Esophagitis   . 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 Hyperplastic polyp of intestine   . Non-celiac gluten sensitivity   . Rotator cuff impingement syndrome   . Thyroid disease   . Vaginal Pap smear, abnormal     Past Surgical History:  Procedure Laterality Date  . APPENDECTOMY    . BACTERIAL OVERGROWTH TEST N/A  02/10/2016   Procedure: BACTERIAL OVERGROWTH TEST;  Surgeon: Danie Binder, MD;  [facility];  Service: Endoscopy;  Laterality: N/A;  0700  . BACTERIAL OVERGROWTH TEST N/A 04/13/2016   Procedure: BACTERIAL OVERGROWTH TEST;  Surgeon: Danie Binder, MD;  Location: [facility];  Service: Endoscopy;  Laterality: N/A;  0700  . CESAREAN SECTION  1998  . COLONOSCOPY  2006 DB   HYPERPLASTIC POLYP  . COLONOSCOPY N/A 09/12/2015   Procedure: COLONOSCOPY;  Surgeon: Danie Binder, MD;  Location: [facility];  Service: Endoscopy;  Laterality: N/A;  1115   . DILATION AND CURETTAGE OF UTERUS  1997   s/p spontaneous abortion  . ESOPHAGOGASTRODUODENOSCOPY N/A 10/17/2015   Procedure: ESOPHAGOGASTRODUODENOSCOPY (EGD);  Surgeon: Danie Binder, MD;  Location: [facility];  Service: Endoscopy;  Laterality: N/A;  830   . EXPLORATORY LAPROSCOPY     Dx PID  . Tendon reattachment  1999   Right hand    Current Outpatient Medications  Medication Sig Dispense Refill  . Cholecalciferol (VITAMIN D-3) 125 MCG (5000 UT) TABS Take 1 tablet by mouth daily.    Marland Kitchen estradiol (ESTRACE) 2 MG tablet Take 1 tablet (2 mg total) by mouth daily. 90 tablet 0  . Flax OIL Take by mouth. 1 Tbsp daily    . Melatonin 10 MG CAPS Take 1 mL by mouth daily.     . Misc Natural Products (NF FORMULAS TESTOSTERONE) CAPS Take 15 mg by mouth daily. 30 capsule 2  .  NP THYROID 120 MG tablet TAKE 1 TABLET BY MOUTH DAILY. 30 tablet 3  . progesterone (PROMETRIUM) 200 MG capsule TAKE (1) CAPSULE BY MOUTH AT BEDTIME. 30 capsule 3  . Zinc 50 MG CAPS Take 1 capsule by mouth once a week.     No current facility-administered medications for this visit.    Allergies as of 11/20/2020 - Review Complete 11/20/2020  Allergen Reaction Noted  . Penicillins  04/01/2008    Family History  Problem Relation Age of Onset  . Coronary artery disease Mother   . Heart Problems Brother   . Hypertension Brother   . Heart Problems Brother   .  Hypertension Brother   . Colon cancer Paternal Uncle   . Lung cancer Father   . Coronary artery disease Sister     Social History   Socioeconomic History  . Marital status: Married    Spouse name: Not on file  . Number of children: Not on file  . Years of education: Not on file  . Highest education level: Not on file  Occupational History  . Not on file  Tobacco Use  . Smoking status: Never Smoker  . Smokeless tobacco: Never Used  Vaping Use  . Vaping Use: Never used  Substance and Sexual Activity  . Alcohol use: No    Alcohol/week: 0.0 standard drinks  . Drug use: No  . Sexual activity: Yes    Birth control/protection: Post-menopausal  Other Topics Concern  . 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   Social Determinants of Health   Financial Resource Strain: Not on file  Food Insecurity: Not on file  Transportation Needs: Not on file  Physical Activity: Not on file  Stress: Not on file  Social Connections: Not on file    Subjective: Review of Systems  Constitutional: Negative for chills, fever, malaise/fatigue and weight loss.  HENT: Negative for congestion and sore throat.   Respiratory: Negative for cough and shortness of breath.   Cardiovascular: Negative for chest pain and palpitations.  Gastrointestinal: Negative for abdominal pain, blood in stool, diarrhea, heartburn, melena, nausea and vomiting.  Musculoskeletal: Negative for joint pain and myalgias.  Skin: Negative for rash.  Neurological: Negative for dizziness and weakness.  Endo/Heme/Allergies: Does not bruise/bleed easily.  Psychiatric/Behavioral: Negative for depression. The patient is not nervous/anxious.   All other systems reviewed and are negative.    Objective: BP 114/68   Pulse 68   Temp (!) 97.1 F (36.2 C) (Temporal)   Ht 5\' 4"  (1.626 m)   Wt 123 lb 3.2 oz (55.9 kg)   BMI 21.15 kg/m  Physical  Exam Vitals and nursing note reviewed.  Constitutional:      General: She is not in acute distress.    Appearance: Normal appearance. She is well-developed and normal weight. She is not ill-appearing, toxic-appearing or diaphoretic.  HENT:     Head: Normocephalic and atraumatic.     Nose: No congestion or rhinorrhea.  Eyes:     General: No scleral icterus. Cardiovascular:     Rate and Rhythm: Normal rate and regular rhythm.     Heart sounds: Normal heart sounds.  Pulmonary:     Effort: Pulmonary effort is normal. No respiratory distress.     Breath sounds: Normal breath sounds.  Abdominal:     General: Bowel sounds are normal.     Palpations: Abdomen is soft. There is no hepatomegaly, splenomegaly or  mass.     Tenderness: There is no abdominal tenderness. There is no guarding or rebound.     Hernia: No hernia is present.  Skin:    General: Skin is warm and dry.     Coloration: Skin is not jaundiced.     Findings: No rash.  Neurological:     General: No focal deficit present.     Mental Status: She is alert and oriented to person, place, and time.  Psychiatric:        Attention and Perception: Attention normal.        Mood and Affect: Mood normal.        Speech: Speech normal.        Behavior: Behavior normal.        Thought Content: Thought content normal.        Cognition and Memory: Cognition and memory normal.      Assessment:  Very pleasant 60 year old female presents for follow-up.  She has not been seen since 2017.  Colonoscopy currently up-to-date next due in 2024.  History of GERD, currently well managed with dietary changes and trigger avoidance.  Not currently on a PPI.  No breakthrough symptoms.  Occasionally she will have some belching, but does not seem significantly bothersome to her.  Overall, she is asymptomatic from a GI standpoint.  Recommend she continue her current medications and follow-up as needed.  Colonoscopy to be completed in  2024.   Plan: 1. Continue current medications 2. Follow-up in 2024 for surveillance colonoscopy 3. Follow-up as needed otherwise.    Thank you for allowing [place] to participate in the care of [place], DNP, AGNP-C Adult & Gerontological Nurse Practitioner [facility]   11/20/2020 10:19 AM   Disclaimer: This note was dictated with voice recognition software. Similar sounding words can inadvertently be transcribed and may not be corrected upon review.

## 2020-11-20 NOTE — Progress Notes (Signed)
Cc'ed to pcp °

## 2021-01-13 ENCOUNTER — Ambulatory Visit (INDEPENDENT_AMBULATORY_CARE_PROVIDER_SITE_OTHER): Payer: BC Managed Care – PPO

## 2021-01-13 ENCOUNTER — Encounter (HOSPITAL_COMMUNITY): Payer: Self-pay | Admitting: Emergency Medicine

## 2021-01-13 ENCOUNTER — Other Ambulatory Visit: Payer: Self-pay

## 2021-01-13 ENCOUNTER — Emergency Department (HOSPITAL_COMMUNITY)
Admission: EM | Admit: 2021-01-13 | Discharge: 2021-01-13 | Disposition: A | Discharge status: Home / Self Care | Payer: BC Managed Care – PPO | Attending: Emergency Medicine | Admitting: Emergency Medicine

## 2021-01-13 ENCOUNTER — Ambulatory Visit
Admission: EM | Admit: 2021-01-13 | Discharge: 2021-01-13 | Disposition: A | Discharge status: Home / Self Care | Payer: BC Managed Care – PPO | Attending: Family Medicine | Admitting: Family Medicine

## 2021-01-13 ENCOUNTER — Encounter: Payer: Self-pay | Admitting: Emergency Medicine

## 2021-01-13 DIAGNOSIS — E039 Hypothyroidism, unspecified: Secondary | ICD-10-CM | POA: Diagnosis not present

## 2021-01-13 DIAGNOSIS — Z79899 Other long term (current) drug therapy: Secondary | ICD-10-CM | POA: Diagnosis not present

## 2021-01-13 DIAGNOSIS — Z23 Encounter for immunization: Secondary | ICD-10-CM | POA: Insufficient documentation

## 2021-01-13 DIAGNOSIS — S91112A Laceration without foreign body of left great toe without damage to nail, initial encounter: Secondary | ICD-10-CM | POA: Diagnosis not present

## 2021-01-13 DIAGNOSIS — S99922A Unspecified injury of left foot, initial encounter: Secondary | ICD-10-CM | POA: Diagnosis not present

## 2021-01-13 DIAGNOSIS — S92512B Displaced fracture of proximal phalanx of left lesser toe(s), initial encounter for open fracture: Secondary | ICD-10-CM | POA: Insufficient documentation

## 2021-01-13 DIAGNOSIS — W208XXA Other cause of strike by thrown, projected or falling object, initial encounter: Secondary | ICD-10-CM | POA: Insufficient documentation

## 2021-01-13 DIAGNOSIS — S92352B Displaced fracture of fifth metatarsal bone, left foot, initial encounter for open fracture: Secondary | ICD-10-CM | POA: Diagnosis not present

## 2021-01-13 DIAGNOSIS — S92502B Displaced unspecified fracture of left lesser toe(s), initial encounter for open fracture: Secondary | ICD-10-CM

## 2021-01-13 DIAGNOSIS — S91115A Laceration without foreign body of left lesser toe(s) without damage to nail, initial encounter: Secondary | ICD-10-CM | POA: Diagnosis not present

## 2021-01-13 DIAGNOSIS — S92592A Other fracture of left lesser toe(s), initial encounter for closed fracture: Secondary | ICD-10-CM | POA: Diagnosis not present

## 2021-01-13 MED ORDER — TETANUS-DIPHTH-ACELL PERTUSSIS 5-2.5-18.5 LF-MCG/0.5 IM SUSY
0.5000 mL | PREFILLED_SYRINGE | Freq: Once | INTRAMUSCULAR | Status: AC
  Administered 2021-01-13: 0.5 mL via INTRAMUSCULAR
  Filled 2021-01-13: qty 0.5

## 2021-01-13 MED ORDER — CEFAZOLIN SODIUM-DEXTROSE 2-4 GM/100ML-% IV SOLN
2.0000 g | Freq: Once | INTRAVENOUS | Status: AC
  Administered 2021-01-13: 2 g via INTRAVENOUS
  Filled 2021-01-13: qty 100

## 2021-01-13 MED ORDER — CEPHALEXIN 500 MG PO CAPS: 500.0000 mg | ORAL_CAPSULE | Freq: Two times a day (BID) | ORAL | 0 refills | Status: AC

## 2021-01-13 MED ORDER — LIDOCAINE HCL (PF) 1 % IJ SOLN
5.0000 mL | Freq: Once | INTRAMUSCULAR | Status: AC
  Administered 2021-01-13: 5 mL via INTRADERMAL
  Filled 2021-01-13: qty 30

## 2021-01-13 NOTE — ED Provider Notes (Signed)
[facility]: [facility] EMERGENCY DEPARTMENT Provider Note   CSN: [id number] Arrival date & time: 01/13/21  9357     History Chief Complaint  Patient presents with  . Toe Injury    Left pinky toe.     Leah Cuevas is a 60 y.o. female who presents emergency department with a chief complaint of left little toe injury.  She was seen earlier today at the urgent care and had an x-ray which is in her chart and I have reviewed those images.  There is concern for open fracture and she was sent to the emergency department for further evaluation.  She is unsure of her last tetanus vaccination.  Pain is moderate.  She is able to ambulate on the foot.  She denies any numbness or tingling.  HPI     Past Medical History:  Diagnosis Date  . Allergy   . Anemia   . Esophagitis   . 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 Hyperplastic polyp of intestine   . Non-celiac gluten sensitivity   . Rotator cuff impingement syndrome   . Thyroid disease   . Vaginal Pap smear, abnormal     Patient Active Problem List   Diagnosis Date Noted  . History of Helicobacter pylori infection 11/20/2020  . 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 Screening for colorectal cancer 06/21/2019  . Encounter for gynecological examination with Papanicolaou smear of cervix 06/21/2019  . Left lower quadrant abdominal tenderness without rebound tenderness 06/21/2019  . Small intestinal bacterial overgrowth 06/01/2016  . H. pylori infection 01/19/2016  . GERD (gastroesophageal reflux disease) 01/19/2016  . Dyspepsia 08/20/2015  . History of adenomatous polyp of colon 08/20/2015  . Anemia, iron deficiency 06/26/2015  . Bilateral dry eyes 06/26/2015  . Pinguecula of both eyes 06/26/2015  . Rash and nonspecific skin eruption 05/21/2015  . Actinic keratosis of left cheek 05/21/2015  . Ankle edema 02/06/2015  . Dyspareunia 02/05/2015  . Foot pain, left 07/08/2014  . Unspecified visual  disturbance 01/30/2014  . New onset tinnitus of right ear 10/29/2013  . Hyperplastic polyp of intestine 12/09/2011  . Vitamin D deficiency disease 12/09/2011  . Non-celiac gluten sensitivity 12/08/2010  . Hypothyroidism 10/22/2010  . ANEMIA, HX OF 04/01/2008  . ESOPHAGITIS, UNSPECIFIED 10/13/2006    Past Surgical History:  Procedure Laterality Date  . APPENDECTOMY    . BACTERIAL OVERGROWTH TEST N/A 02/10/2016   Procedure: BACTERIAL OVERGROWTH TEST;  Surgeon: Danie Binder, MD;  [facility];  Service: Endoscopy;  Laterality: N/A;  0700  . BACTERIAL OVERGROWTH TEST N/A 04/13/2016   Procedure: BACTERIAL OVERGROWTH TEST;  Surgeon: Danie Binder, MD;  [facility];  Service: Endoscopy;  Laterality: N/A;  0700  . CESAREAN SECTION  1998  . COLONOSCOPY  2006 DB   HYPERPLASTIC POLYP  . COLONOSCOPY N/A 09/12/2015   Procedure: COLONOSCOPY;  Surgeon: Danie Binder, MD;  [facility]: [facility];  Service: Endoscopy;  Laterality: N/A;  1115   . DILATION AND CURETTAGE OF UTERUS  1997   s/p spontaneous abortion  . ESOPHAGOGASTRODUODENOSCOPY N/A 10/17/2015   Procedure: ESOPHAGOGASTRODUODENOSCOPY (EGD);  Surgeon: Danie Binder, MD;  [facility];  Service: Endoscopy;  Laterality: N/A;  830   . EXPLORATORY LAPROSCOPY     Dx PID  . Tendon reattachment  1999   Right hand     OB History    Gravida  3   Para  1  Term      Preterm      AB  2   Living  1     SAB      IAB      Ectopic      Multiple      Live Births              Family History  Problem Relation Age of Onset  . Coronary artery disease Mother   . Heart Problems Brother   . Hypertension Brother   . Heart Problems Brother   . Hypertension Brother   . Colon cancer Paternal Uncle   . Lung cancer Father   . Coronary artery disease Sister     Social History   Tobacco Use  . Smoking status: Never Smoker  . Smokeless tobacco: Never Used  Vaping Use  . Vaping Use: Never  used  Substance Use Topics  . Alcohol use: No    Alcohol/week: 0.0 standard drinks  . Drug use: No    Home Medications Prior to Admission medications   Medication Sig Start Date End Date Taking? Authorizing Provider  Cholecalciferol (VITAMIN D-3) 125 MCG (5000 UT) TABS Take 1 tablet by mouth daily.    [provider]  estradiol (ESTRACE) 2 MG tablet Take 1 tablet (2 mg total) by mouth daily. 11/11/20 12/11/20  Doree Albee, MD  Flax OIL Take by mouth. 1 Tbsp daily    [provider]  Melatonin 10 MG CAPS Take 1 mL by mouth daily.     [provider]  Misc Natural Products (NF FORMULAS TESTOSTERONE) CAPS Take 15 mg by mouth daily. 05/21/19   Doree Albee, MD  NP THYROID 120 MG tablet TAKE 1 TABLET BY MOUTH DAILY. 10/28/20   Doree Albee, MD  progesterone (PROMETRIUM) 200 MG capsule TAKE (1) CAPSULE BY MOUTH AT BEDTIME. 10/02/20 11/01/20  Hurshel Party C, MD  Zinc 50 MG CAPS Take 1 capsule by mouth once a week.    [provider]    Allergies    Penicillins  Review of Systems   Review of Systems Ten systems reviewed and are negative for acute change, except as noted in the HPI.   Physical Exam Updated Vital Signs BP 138/80   Pulse 64   Temp 98.7 F (37.1 C) (Oral)   Resp 16   Ht 5\' 4"  (1.626 m)   Wt 54.4 kg   SpO2 99%   BMI 20.60 kg/m   Physical Exam Vitals and nursing note reviewed.  Constitutional:      General: She is not in acute distress.    Appearance: She is well-developed. She is not diaphoretic.  HENT:     Head: Normocephalic and atraumatic.  Eyes:     General: No scleral icterus.    Conjunctiva/sclera: Conjunctivae normal.  Cardiovascular:     Rate and Rhythm: Normal rate and regular rhythm.     Heart sounds: Normal heart sounds. No murmur heard. No friction rub. No gallop.   Pulmonary:     Effort: Pulmonary effort is normal. No respiratory distress.     Breath sounds: Normal breath sounds.  Abdominal:      General: Bowel sounds are normal. There is no distension.     Palpations: Abdomen is soft. There is no mass.     Tenderness: There is no abdominal tenderness. There is no guarding.  Musculoskeletal:     Cervical back: Normal range of motion.     Comments: Left  foot with bruising and swelling over the mid and distal fourth and fifth metatarsals of the left foot.  There is a 2 cm laceration over the left fifth digit with bruising and bleeding noted.  Skin:    General: Skin is warm and dry.  Neurological:     Mental Status: She is alert and oriented to person, place, and time.  Psychiatric:        Behavior: Behavior normal.     ED Results / Procedures / Treatments   Labs (all labs ordered are listed, but only abnormal results are displayed) Labs Reviewed - No data to display  EKG None  Radiology DG Foot Complete Left  Result Date: 01/13/2021 CLINICAL DATA:  Dropped 25 pound weight on foot this morning now laceration about the fifth toe. Remote history of previous fractures. 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 Comminuted fracture at the location of ankylosis of the DIP joint. Additionally, there is a suspected acute nondisplaced fracture involving the base of the middle phalanx of the fifth digit without extension to involve the PIP joint. Expected adjacent soft tissue swelling. No radiopaque foreign body. No additional fractures are identified. 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 Severe hallux valgus deformity with mild degenerative change with joint space loss, and osteophytosis. No discrete erosions. No plantar calcaneal spur. IMPRESSION: 1. Comminuted fracture involving the ankylosis of the DIP joint of the fifth digit. 2. Suspected acute nondisplaced fracture involving the  base of the middle phalanx of the fifth digit without definitive intra-articular extension. Electronically Signed   By: Sandi Mariscal M.D.   On: 01/13/2021 08:55    Procedures .Marland KitchenLaceration Repair  Date/Time: 01/13/2021 12:28 PM Performed by: Margarita Mail, PA-C Authorized by: Margarita Mail, PA-C   Consent:    Consent obtained:  Verbal   Consent given by:  Patient   Risks, benefits, and alternatives were discussed: yes     Risks discussed:  Pain, retained foreign body, need for additional repair, nerve damage and infection   Alternatives discussed:  No treatment Universal protocol:    Patient identity confirmed:  Arm band Anesthesia:    Anesthesia method:  Local infiltration   Local anesthetic:  Lidocaine 1% w/o epi Laceration details:    Location:  Toe   Toe location:  L little toe   Length (cm):  2 Pre-procedure details:    Preparation:  Patient was prepped and draped in usual sterile fashion Exploration:    Hemostasis achieved with:  Direct pressure   Wound exploration: wound explored through full range of motion and entire depth of wound visualized     Wound extent: underlying fracture     Contaminated: no   Treatment:    Area cleansed with:  Povidone-iodine   Amount of cleaning:  Extensive   Irrigation solution:  Sterile saline   Irrigation method:  Pressure wash   Debridement:  None Skin repair:    Repair method:  Sutures   Suture size:  5-0   Suture material:  Prolene   Suture technique:  Simple interrupted   Number of sutures:  3 Approximation:    Approximation:  Close Repair type:    Repair type:  Intermediate Post-procedure details:    Dressing:  Sterile dressing   Procedure completion:  Tolerated well, no immediate complications  Medications Ordered in ED Medications  ceFAZolin (ANCEF) IVPB 1 g/50 mL premix (has no administration in time range)    ED Course  I have reviewed the triage vital signs and the nursing notes.  Pertinent labs &  imaging results that were available during my care of the patient were reviewed by me and considered in my medical decision making (see chart for details).    MDM Rules/Calculators/A&P                         Patient here with open left toe fracture.  I sterilely repaired the patient's laceration.  Patient given Ancef here in the emergency department will be discharged on Keflex.  She does not feel that she needs crutches but was placed in a postop shoe with outpatient follow-up with her PCP.  Patient also given wound care instructions.  She appears otherwise appropriate for discharge.  Discussed return precautions. Final Clinical Impression(s) / ED Diagnoses Final diagnoses:  Open displaced fracture of phalanx of lesser toe of left foot, unspecified phalanx, initial encounter    Rx / DC Orders ED Discharge Orders    None       Margarita Mail, PA-C 01/13/21 1235    Fredia Sorrow, MD 01/15/21 [phone]

## 2021-01-13 NOTE — ED Triage Notes (Signed)
Dropped 25 lb weight plate on her LT 5th toe this morning

## 2021-01-13 NOTE — ED Notes (Signed)
A Harris PA in with pt suturing toe

## 2021-01-13 NOTE — ED Provider Notes (Signed)
[facility]    CSN: [id number] Arrival date & time: 01/13/21  0801      History   Chief Complaint Chief Complaint  Patient presents with  . Toe Injury    HPI Leah Cuevas is a 60 y.o. female.   Reports that she was working out this morning and dropped a 25 pound weight on her left fifth toe.  Reports that the area is painful, bleeding, she cannot move the toe.  Has not attempted OTC treatment.  Denies previous symptoms.  Denies foreign body to the area, denies seeing any bone or other deeper tissue.  ROS per HPI  The history is provided by the patient.    Past Medical History:  Diagnosis Date  . Allergy   . Anemia   . Esophagitis   . 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 Hyperplastic polyp of intestine   . Non-celiac gluten sensitivity   . Rotator cuff impingement syndrome   . Thyroid disease   . Vaginal Pap smear, abnormal     Patient Active Problem List   Diagnosis Date Noted  . History of Helicobacter pylori infection 11/20/2020  . 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 Screening for colorectal cancer 06/21/2019  . Encounter for gynecological examination with Papanicolaou smear of cervix 06/21/2019  . Left lower quadrant abdominal tenderness without rebound tenderness 06/21/2019  . Small intestinal bacterial overgrowth 06/01/2016  . H. pylori infection 01/19/2016  . GERD (gastroesophageal reflux disease) 01/19/2016  . Dyspepsia 08/20/2015  . History of adenomatous polyp of colon 08/20/2015  . Anemia, iron deficiency 06/26/2015  . Bilateral dry eyes 06/26/2015  . Pinguecula of both eyes 06/26/2015  . Rash and nonspecific skin eruption 05/21/2015  . Actinic keratosis of left cheek 05/21/2015  . Ankle edema 02/06/2015  . Dyspareunia 02/05/2015  . Foot pain, left 07/08/2014  . Unspecified visual disturbance 01/30/2014  . New onset tinnitus of right ear 10/29/2013  . Hyperplastic polyp of intestine  12/09/2011  . Vitamin D deficiency disease 12/09/2011  . Non-celiac gluten sensitivity 12/08/2010  . Hypothyroidism 10/22/2010  . ANEMIA, HX OF 04/01/2008  . ESOPHAGITIS, UNSPECIFIED 10/13/2006    Past Surgical History:  Procedure Laterality Date  . APPENDECTOMY    . BACTERIAL OVERGROWTH TEST N/A 02/10/2016   Procedure: BACTERIAL OVERGROWTH TEST;  Surgeon: Danie Binder, MD;  [facility];  Service: Endoscopy;  Laterality: N/A;  0700  . BACTERIAL OVERGROWTH TEST N/A 04/13/2016   Procedure: BACTERIAL OVERGROWTH TEST;  Surgeon: Danie Binder, MD;  [facility];  Service: Endoscopy;  Laterality: N/A;  0700  . CESAREAN SECTION  1998  . COLONOSCOPY  2006 DB   HYPERPLASTIC POLYP  . COLONOSCOPY N/A 09/12/2015   Procedure: COLONOSCOPY;  Surgeon: Danie Binder, MD;  [facility]: [facility];  Service: Endoscopy;  Laterality: N/A;  1115   . DILATION AND CURETTAGE OF UTERUS  1997   s/p spontaneous abortion  . ESOPHAGOGASTRODUODENOSCOPY N/A 10/17/2015   Procedure: ESOPHAGOGASTRODUODENOSCOPY (EGD);  Surgeon: Danie Binder, MD;  [facility];  Service: Endoscopy;  Laterality: N/A;  830   . EXPLORATORY LAPROSCOPY     Dx PID  . Tendon reattachment  1999   Right hand    OB History    Gravida  3   Para  1   Term      Preterm      AB  2   Living  1     SAB      IAB      Ectopic      Multiple      Live Births               Home Medications    Prior to Admission medications   Medication Sig Start Date End Date Taking? Authorizing Provider  Cholecalciferol (VITAMIN D-3) 125 MCG (5000 UT) TABS Take 1 tablet by mouth daily.    [provider]  estradiol (ESTRACE) 2 MG tablet Take 1 tablet (2 mg total) by mouth daily. 11/11/20 12/11/20  Doree Albee, MD  Flax OIL Take by mouth. 1 Tbsp daily    [provider]  Melatonin 10 MG CAPS Take 1 mL by mouth daily.     [provider]  Misc Natural Products (NF FORMULAS  TESTOSTERONE) CAPS Take 15 mg by mouth daily. 05/21/19   Doree Albee, MD  NP THYROID 120 MG tablet TAKE 1 TABLET BY MOUTH DAILY. 10/28/20   Doree Albee, MD  progesterone (PROMETRIUM) 200 MG capsule TAKE (1) CAPSULE BY MOUTH AT BEDTIME. 10/02/20 11/01/20  Hurshel Party C, MD  Zinc 50 MG CAPS Take 1 capsule by mouth once a week.    [provider]    Family History Family History  Problem Relation Age of Onset  . Coronary artery disease Mother   . Heart Problems Brother   . Hypertension Brother   . Heart Problems Brother   . Hypertension Brother   . Colon cancer Paternal Uncle   . Lung cancer Father   . Coronary artery disease Sister     Social History Social History   Tobacco Use  . Smoking status: Never Smoker  . Smokeless tobacco: Never Used  Vaping Use  . Vaping Use: Never used  Substance Use Topics  . Alcohol use: No    Alcohol/week: 0.0 standard drinks  . Drug use: No     Allergies   Penicillins   Review of Systems Review of Systems   Physical Exam Triage Vital Signs ED Triage Vitals  Enc Vitals Group     BP 01/13/21 0814 (!) 149/80     Pulse Rate 01/13/21 0814 89     Resp 01/13/21 0814 19     Temp 01/13/21 0814 98.2 F (36.8 C)     Temp Source 01/13/21 0814 Oral     SpO2 01/13/21 0814 98 %     Weight --      Height --      Head Circumference --      Peak Flow --      Pain Score 01/13/21 0815 7     Pain Loc --      Pain Edu? --      Excl. in [place]? --    No data found.  Updated Vital Signs BP (!) 149/80 (BP Location: Right Arm)   Pulse 89   Temp 98.2 F (36.8 C) (Oral)   Resp 19   SpO2 98%   Physical Exam Vitals and nursing note reviewed.  Constitutional:      General: She is not in acute distress.    Appearance: Normal appearance. She is well-developed and normal weight. She is not ill-appearing.  HENT:     Head: Normocephalic and atraumatic.     Nose: Nose normal.     Mouth/Throat:     Mouth: Mucous membranes are  moist.     Pharynx: Oropharynx is clear.  Eyes:     Extraocular Movements: Extraocular movements intact.     Conjunctiva/sclera: Conjunctivae normal.     Pupils: Pupils are equal, round, and reactive to light.  Cardiovascular:     Rate and Rhythm: Normal rate and regular rhythm.  Pulmonary:     Effort: Pulmonary effort is normal. No respiratory distress.  Musculoskeletal:        General: Swelling and tenderness present.     Cervical back: Neck supple.  Skin:    General: Skin is warm and dry.     Capillary Refill: Capillary refill takes less than 2 seconds.     Findings: Laceration present.          Comments: 1 inch laceration to dorsal surface of left fifth metatarsal  Neurological:     General: No focal deficit present.     Mental Status: She is alert and oriented to person, place, and time.  Psychiatric:        Mood and Affect: Mood normal.        Behavior: Behavior normal.        Thought Content: Thought content normal.      UC Treatments / Results  Labs (all labs ordered are listed, but only abnormal results are displayed) Labs Reviewed - No data to display  EKG   Radiology No results found.  Procedures Procedures (including critical care time)  Medications Ordered in UC Medications - No data to display  Initial Impression / Assessment and Plan / UC Course  I have reviewed the triage vital signs and the nursing notes.  Pertinent labs & imaging results that were available during my care of the patient were reviewed by me and considered in my medical decision making (see chart for details).    Open displaced fracture of fifth metatarsal bone of the left foot Initial encounter  Follow-up in the ER as this is now an open fracture May need possible surgical repair Patient agrees with treatment plan and will go to ER via POV  Final Clinical Impressions(s) / UC Diagnoses   Final diagnoses:  Open displaced fracture of fifth metatarsal bone of left foot,  initial encounter     Discharge Instructions     I am going to have to go to the ER for further evaluation of your toe  This is now considered an open fracture    ED Prescriptions    None     PDMP not reviewed this encounter.   Faustino Congress, NP 01/13/21 [phone]

## 2021-01-13 NOTE — ED Triage Notes (Signed)
Dropped 25 pound weight on left pinky toe.  Pt seen at Urgent Care, xray reviled several fractures.  Instructed to come to ED.  Rates pian 9/10.

## 2021-01-13 NOTE — Discharge Instructions (Signed)
I am going to have to go to the ER for further evaluation of your toe  This is now considered an open fracture

## 2021-01-13 NOTE — Discharge Instructions (Addendum)
WOUND CARE Please have your stitches/staples removed in 10 days or sooner if you have concerns. You may do this at any available urgent care or at your primary care doctor's office.  Keep area clean and dry for 24 hours. Do not remove bandage, if applied.  After 24 hours, remove bandage and wash wound gently with mild soap and warm water. Reapply a new bandage after cleaning wound, if directed.  Continue daily cleansing with soap and water until stitches/staples are removed.  Do not apply any ointments or creams to the wound while stitches/staples are in place, as this may cause delayed healing.  Seek medical careif you experience any of the following signs of infection: Swelling, redness, pus drainage, streaking, fever >101.0 F  Seek care if you experience excessive bleeding that does not stop after 15-20 minutes of constant, firm pressure.

## 2021-01-13 NOTE — ED Notes (Signed)
Patient is being discharged from the Urgent Care and sent to the Emergency Department via pov . Per Leah Cuevas  patient is in need of higher level of care due to further evaul of injury to LT fifth toe. Patient is aware and verbalizes understanding of plan of care.  Vitals:   01/13/21 0814  BP: (!) 149/80  Pulse: 89  Resp: 19  Temp: 98.2 F (36.8 C)  SpO2: 98%

## 2021-01-19 ENCOUNTER — Other Ambulatory Visit: Payer: Self-pay

## 2021-01-19 ENCOUNTER — Ambulatory Visit
Admission: EM | Admit: 2021-01-19 | Discharge: 2021-01-19 | Disposition: A | Discharge status: Home / Self Care | Payer: BC Managed Care – PPO

## 2021-01-19 NOTE — ED Triage Notes (Signed)
Pt here for suture removal. 3 sutures removed from toe, tolerated well

## 2021-01-29 ENCOUNTER — Ambulatory Visit (INDEPENDENT_AMBULATORY_CARE_PROVIDER_SITE_OTHER): Payer: BC Managed Care – PPO | Admitting: Internal Medicine

## 2021-01-29 ENCOUNTER — Other Ambulatory Visit: Payer: Self-pay

## 2021-01-29 ENCOUNTER — Encounter (INDEPENDENT_AMBULATORY_CARE_PROVIDER_SITE_OTHER): Payer: Self-pay | Admitting: Internal Medicine

## 2021-01-29 VITALS — BP 110/67 | HR 76 | Temp 97.9°F | Resp 18 | Ht 64.0 in | Wt 122.5 lb

## 2021-01-29 DIAGNOSIS — M79672 Pain in left foot: Secondary | ICD-10-CM | POA: Diagnosis not present

## 2021-01-29 MED ORDER — PROGESTERONE 200 MG PO CAPS: 200.0000 mg | ORAL_CAPSULE | Freq: Every evening | ORAL | 1 refills | Status: DC

## 2021-01-29 NOTE — Progress Notes (Signed)
Metrics: Intervention Frequency ACO  Documented Smoking Status Yearly  Screened one or more times in 24 months  Cessation Counseling or  Active cessation medication Past 24 months  Past 24 months   Guideline developer: UpToDate (See UpToDate for funding source) Date Released: 2014       Wellness Office Visit  Subjective:  Patient ID: Leah Cuevas, female    DOB: 04-Jan-1961  Age: 60 y.o. MRN: [id number]  CC: Left foot pain HPI  The patient would like to referral back to podiatry because of overlapping of the second toe with the big toe.  This has been a chronic problem and now she would like something done about it. 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   Hyperplastic polyp of intestine    Non-celiac gluten sensitivity    Rotator cuff impingement syndrome    Thyroid disease    Vaginal Pap smear, abnormal    Past Surgical History:  Procedure Laterality Date   APPENDECTOMY     BACTERIAL OVERGROWTH TEST N/A 02/10/2016   Procedure: BACTERIAL OVERGROWTH TEST;  Surgeon: Danie Binder, MD;  [facility];  Service: Endoscopy;  Laterality: N/A;  0700   BACTERIAL OVERGROWTH TEST N/A 04/13/2016   Procedure: BACTERIAL OVERGROWTH TEST;  Surgeon: Danie Binder, MD;  [facility];  Service: Endoscopy;  Laterality: N/A;  0700   [place]   COLONOSCOPY  2006 DB   HYPERPLASTIC POLYP   COLONOSCOPY N/A 09/12/2015   Procedure: COLONOSCOPY;  Surgeon: Danie Binder, MD;  [facility];  Service: Endoscopy;  Laterality: N/A;  [place]   s/p spontaneous abortion   ESOPHAGOGASTRODUODENOSCOPY N/A 10/17/2015   Procedure: ESOPHAGOGASTRODUODENOSCOPY (EGD);  Surgeon: Danie Binder, MD;  [facility];  Service: Endoscopy;  Laterality: N/A;  73    EXPLORATORY LAPROSCOPY     Dx PID   Tendon reattachment  1999    Right hand     Family History  Problem Relation Age of Onset   Coronary artery disease Mother    Heart Problems Brother    Hypertension Brother    Heart Problems Brother    Hypertension Brother    Colon cancer Paternal Uncle    Lung cancer Father    Coronary artery disease Sister     Social History   Social History Narrative   Married since 2007.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   Social History   Tobacco Use   Smoking status: Never   Smokeless tobacco: Never  Substance Use Topics   Alcohol use: No    Alcohol/week: 0.0 standard drinks    Current Meds  Medication Sig   Cholecalciferol (VITAMIN D-3) 125 MCG (5000 UT) TABS Take 1 tablet by mouth daily.   Misc Natural Products (NF FORMULAS TESTOSTERONE) CAPS Take 15 mg by mouth daily.   NP THYROID 120 MG tablet TAKE 1 TABLET BY MOUTH DAILY.   Zinc 50 MG CAPS Take 1 capsule by mouth once a week.     [place] Office Visit from 08/28/2020 in [facility]  PHQ-9 Total Score 0       Objective:   Today's Vitals: BP 110/67 (BP Location: Left Arm, Patient Position: Sitting, Cuff Size: Small)   Pulse 76   Temp 97.9 F (36.6  C) (Temporal)   Resp 18   Ht 5\' 4"  (1.626 m)   Wt 122 lb 8 oz (55.6 kg)   SpO2 98%   BMI 21.03 kg/m  Vitals with BMI 01/29/2021 01/13/2021 01/13/2021  Height 5\' 4"  - -  Weight 122 lbs 8 oz - -  BMI 71.85 - -  Systolic 501 586 825  Diastolic 67 75 80  Pulse 76 63 64     Physical Exam  She looks systemically well.  No new physical findings except the deformity seen that she mention.     Assessment   1. Left foot pain       Tests ordered Orders Placed This Encounter  Procedures   Ambulatory referral to Podiatry      Plan: 1.  I will refer to podiatry.    Meds ordered this encounter  Medications   progesterone (PROMETRIUM) 200 MG capsule    Sig: Take 1 capsule (200 mg total) by mouth at bedtime.    Dispense:  90 capsule     Refill:  1     Casy Tavano Luther Parody, MD

## 2021-01-29 NOTE — Progress Notes (Signed)
Need refill on progesterone

## 2021-02-05 ENCOUNTER — Ambulatory Visit (INDEPENDENT_AMBULATORY_CARE_PROVIDER_SITE_OTHER): Payer: BC Managed Care – PPO

## 2021-02-05 ENCOUNTER — Ambulatory Visit (INDEPENDENT_AMBULATORY_CARE_PROVIDER_SITE_OTHER): Payer: BC Managed Care – PPO | Admitting: Podiatry

## 2021-02-05 ENCOUNTER — Other Ambulatory Visit: Payer: Self-pay

## 2021-02-05 ENCOUNTER — Ambulatory Visit: Payer: BC Managed Care – PPO

## 2021-02-05 DIAGNOSIS — S9032XA Contusion of left foot, initial encounter: Secondary | ICD-10-CM | POA: Diagnosis not present

## 2021-02-05 DIAGNOSIS — M2042 Other hammer toe(s) (acquired), left foot: Secondary | ICD-10-CM

## 2021-02-05 DIAGNOSIS — S92534A Nondisplaced fracture of distal phalanx of right lesser toe(s), initial encounter for closed fracture: Secondary | ICD-10-CM | POA: Diagnosis not present

## 2021-02-05 DIAGNOSIS — M21612 Bunion of left foot: Secondary | ICD-10-CM | POA: Diagnosis not present

## 2021-02-06 ENCOUNTER — Other Ambulatory Visit: Payer: Self-pay | Admitting: Podiatry

## 2021-02-06 ENCOUNTER — Telehealth: Payer: Self-pay | Admitting: Urology

## 2021-02-06 DIAGNOSIS — M21619 Bunion of unspecified foot: Secondary | ICD-10-CM

## 2021-02-06 NOTE — Telephone Encounter (Signed)
DOS - 02/25/21  AIKEN OSTEOTOMY LEFT --- [id number] LAPIDUS PRO INC BUNIONECTOMY LEFT --- [id number] METATARSAL OSTEOTOMY 2ND LEFT --- [id number] HAMMERTOE REPAIR 2ND LEFT --- [id number]   BCBS EFFECTIVE DATE - 12/14/20   PLAN DEDUCTIBLE - $3,000.00 W/ $327.60 REMIAING  OUT OF POCKET - $8,550.00 W/ $5,489.01 REMAINING  COINSURANCE - 40% COPAY - $0.00   NO PRIOR AUTH REQUIRED

## 2021-02-11 ENCOUNTER — Other Ambulatory Visit (INDEPENDENT_AMBULATORY_CARE_PROVIDER_SITE_OTHER): Payer: Self-pay | Admitting: Internal Medicine

## 2021-02-12 NOTE — Progress Notes (Signed)
Subjective:   Patient ID: Leah Cuevas, female   DOB: 60 y.o.   MRN: [id number]   HPI 60 year old female presents the office today to discuss surgical intervention for her left foot bunion, hammertoe.  She states that this is been ongoing for several years and I previously seen her back in 2016 for this.  She has tried shoe modifications, offloading padding but insignificant improvement at this time she wants to discuss surgical intervention.  Also of note she broke her left second toe was seen in the emergency department May 30.  She was originally given a boot but she is now wearing a loose shoe.  Pain is getting better.   Review of Systems  All other systems reviewed and are negative.  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   Hyperplastic polyp of intestine    Non-celiac gluten sensitivity    Rotator cuff impingement syndrome    Thyroid disease    Vaginal Pap smear, abnormal     Past Surgical History:  Procedure Laterality Date   APPENDECTOMY     BACTERIAL OVERGROWTH TEST N/A 02/10/2016   Procedure: BACTERIAL OVERGROWTH TEST;  Surgeon: Danie Binder, MD;  [facility];  Service: Endoscopy;  Laterality: N/A;  0700   BACTERIAL OVERGROWTH TEST N/A 04/13/2016   Procedure: BACTERIAL OVERGROWTH TEST;  Surgeon: Danie Binder, MD;  [facility];  Service: Endoscopy;  Laterality: N/A;  0700   [place]   COLONOSCOPY  2006 DB   HYPERPLASTIC POLYP   COLONOSCOPY N/A 09/12/2015   Procedure: COLONOSCOPY;  Surgeon: Danie Binder, MD;  [facility];  Service: Endoscopy;  Laterality: N/A;  [place]   s/p spontaneous abortion   ESOPHAGOGASTRODUODENOSCOPY N/A 10/17/2015   Procedure: ESOPHAGOGASTRODUODENOSCOPY (EGD);  Surgeon: Danie Binder, MD;  [facility];  Service: Endoscopy;  Laterality: N/A;   830    EXPLORATORY LAPROSCOPY     Dx PID   Tendon reattachment  1999   Right hand     Current Outpatient Medications:    Cholecalciferol (VITAMIN D-3) 125 MCG (5000 UT) TABS, Take 1 tablet by mouth daily., Disp: , Rfl:    estradiol (ESTRACE) 2 MG tablet, Take 1 tablet (2 mg total) by mouth daily., Disp: 90 tablet, Rfl: 1   Misc Natural Products (NF FORMULAS TESTOSTERONE) CAPS, Take 15 mg by mouth daily., Disp: 30 capsule, Rfl: 2   NP THYROID 120 MG tablet, TAKE 1 TABLET BY MOUTH DAILY., Disp: 30 tablet, Rfl: 3   progesterone (PROMETRIUM) 200 MG capsule, Take 1 capsule (200 mg total) by mouth at bedtime., Disp: 90 capsule, Rfl: 1   Zinc 50 MG CAPS, Take 1 capsule by mouth once a week., Disp: , Rfl:   Allergies  Allergen Reactions   Penicillins     REACTION: Vomiting. No rashes or trouble breathing. Has patient had a PCN reaction causing immediate rash, facial/tongue/throat swelling, SOB or lightheadedness with hypotension: No Has patient had a PCN reaction causing severe rash involving mucus membranes or skin necrosis: No Has patient had a PCN reaction that required hospitalization No Has patient had a PCN reaction occurring within the last 10 years: No If all of the above answers are "NO", then may proceed with Cephalosporin use.  Objective:  Physical Exam  General: AAO x3, NAD  Dermatological: Skin is warm, dry and supple bilateral. There are no open sores, no preulcerative lesions, no rash or signs of infection present.  Vascular: Dorsalis Pedis artery and Posterior Tibial artery pedal pulses are 2/4 bilateral with immedate capillary fill time. There is no pain with calf compression, swelling, warmth, erythema.   Neruologic: Grossly intact via light touch bilateral.   Musculoskeletal: Significant bunion is present on the left foot.  There is no pain or crepitation with first imaging range of motion.  Hammertoe deformity is present as well the second digit.  Mild  discomfort in the right fifth toe with mild edema.  The toes rectus.  Muscular strength 5/5 in all groups tested bilateral.  Gait: Unassisted, Nonantalgic.       Assessment:   Left foot symptomatic bunion, hammertoe; right fifth toe fracture     Plan:  -Treatment options discussed including all alternatives, risks, and complications -Etiology of symptoms were discussed -X-rays obtained and reviewed.  Left foot significant bunion is present as well as elongated second metatarsal hammertoe deformities present.  No evidence of acute fracture of the left side. -In regards to the left foot we discussed the conservative possible surgical treatment options.  She has tried multiple conservative treatments over many years without significant provement she was proceed with surgical intervention.  Discussed with her left foot Lapidus bunionectomy with possible Akin osteotomy with second metatarsal shortening osteotomy and second digit hammertoe repair. -The incision placement as well as the postoperative course was discussed with the patient. I discussed risks of the surgery which include, but not limited to, infection, bleeding, pain, swelling, need for further surgery, delayed or nonhealing, painful or ugly scar, numbness or sensation changes, over/under correction, recurrence, transfer lesions, further deformity, hardware failure, DVT/PE, loss of toe/foot. Patient understands these risks and wishes to proceed with surgery. The surgical consent was reviewed with the patient all 3 pages were signed. No promises or guarantees were given to the outcome of the procedure. All questions were answered to the best of my ability. Before the surgery the patient was encouraged to call the office if there is any further questions. The surgery will be performed at the [facility] on an outpatient basis. -Continue shoes on the right side.    Trula Slade DPM

## 2021-03-02 ENCOUNTER — Encounter: Payer: BC Managed Care – PPO | Admitting: Podiatry

## 2021-03-02 ENCOUNTER — Ambulatory Visit (INDEPENDENT_AMBULATORY_CARE_PROVIDER_SITE_OTHER): Payer: BC Managed Care – PPO | Admitting: Internal Medicine

## 2021-03-12 ENCOUNTER — Encounter: Payer: BC Managed Care – PPO | Admitting: Podiatry

## 2021-03-23 DIAGNOSIS — E063 Autoimmune thyroiditis: Secondary | ICD-10-CM | POA: Diagnosis not present

## 2021-03-23 DIAGNOSIS — N951 Menopausal and female climacteric states: Secondary | ICD-10-CM | POA: Diagnosis not present

## 2021-03-26 ENCOUNTER — Encounter: Payer: BC Managed Care – PPO | Admitting: Podiatry

## 2021-03-27 ENCOUNTER — Telehealth: Payer: Self-pay

## 2021-03-27 NOTE — Telephone Encounter (Signed)
Per Dr Posey Pronto - [facility] can not take this patient as Primary Care 03/27/21

## 2021-03-30 DIAGNOSIS — N951 Menopausal and female climacteric states: Secondary | ICD-10-CM | POA: Diagnosis not present

## 2021-03-30 DIAGNOSIS — R232 Flushing: Secondary | ICD-10-CM | POA: Diagnosis not present

## 2021-03-30 DIAGNOSIS — N898 Other specified noninflammatory disorders of vagina: Secondary | ICD-10-CM | POA: Diagnosis not present

## 2021-03-30 DIAGNOSIS — E063 Autoimmune thyroiditis: Secondary | ICD-10-CM | POA: Diagnosis not present

## 2021-04-08 ENCOUNTER — Ambulatory Visit (INDEPENDENT_AMBULATORY_CARE_PROVIDER_SITE_OTHER): Payer: BC Managed Care – PPO | Admitting: Internal Medicine

## 2021-04-16 ENCOUNTER — Encounter: Payer: Self-pay | Admitting: Podiatry

## 2021-04-17 ENCOUNTER — Other Ambulatory Visit (HOSPITAL_COMMUNITY): Payer: Self-pay | Admitting: Adult Health Nurse Practitioner

## 2021-04-17 ENCOUNTER — Telehealth: Payer: Self-pay | Admitting: Urology

## 2021-04-17 DIAGNOSIS — E063 Autoimmune thyroiditis: Secondary | ICD-10-CM | POA: Diagnosis not present

## 2021-04-17 DIAGNOSIS — E038 Other specified hypothyroidism: Secondary | ICD-10-CM | POA: Diagnosis not present

## 2021-04-17 DIAGNOSIS — N952 Postmenopausal atrophic vaginitis: Secondary | ICD-10-CM | POA: Diagnosis not present

## 2021-04-17 NOTE — Telephone Encounter (Signed)
DOS - 05/06/21   AIKEN OSTEOTOMY LEFT --- [id number] LAPIDUS PRO INC BUNIONECTOMY LEFT --- [id number] METATARSAL OSTEOTOMY 2ND LEFT --- [id number] HAMMERTOE REPAIR 2ND LEFT --- [id number]     BCBS EFFECTIVE DATE - 12/14/20     PLAN DEDUCTIBLE - $3,000.00 W/ $0.00 REMIAING  OUT OF POCKET - $8,550.00 W/ $4,916.15 REMAINING  COINSURANCE - 40% COPAY - $0.00     NO PRIOR AUTH REQUIRED

## 2021-04-28 ENCOUNTER — Other Ambulatory Visit: Payer: Self-pay

## 2021-04-28 ENCOUNTER — Ambulatory Visit (HOSPITAL_COMMUNITY)
Admission: RE | Admit: 2021-04-28 | Discharge: 2021-04-28 | Disposition: A | Discharge status: Home / Self Care | Payer: BC Managed Care – PPO | Source: Ambulatory Visit | Attending: Adult Health Nurse Practitioner | Admitting: Adult Health Nurse Practitioner

## 2021-04-28 DIAGNOSIS — E063 Autoimmune thyroiditis: Secondary | ICD-10-CM | POA: Insufficient documentation

## 2021-04-28 DIAGNOSIS — R946 Abnormal results of thyroid function studies: Secondary | ICD-10-CM | POA: Diagnosis not present

## 2021-04-28 DIAGNOSIS — E038 Other specified hypothyroidism: Secondary | ICD-10-CM | POA: Insufficient documentation

## 2021-04-28 DIAGNOSIS — E079 Disorder of thyroid, unspecified: Secondary | ICD-10-CM | POA: Diagnosis not present

## 2021-05-06 ENCOUNTER — Encounter: Payer: Self-pay | Admitting: Podiatry

## 2021-05-06 ENCOUNTER — Other Ambulatory Visit: Payer: Self-pay | Admitting: Podiatry

## 2021-05-06 DIAGNOSIS — M21612 Bunion of left foot: Secondary | ICD-10-CM

## 2021-05-06 DIAGNOSIS — M2042 Other hammer toe(s) (acquired), left foot: Secondary | ICD-10-CM | POA: Diagnosis not present

## 2021-05-06 DIAGNOSIS — M205X2 Other deformities of toe(s) (acquired), left foot: Secondary | ICD-10-CM | POA: Diagnosis not present

## 2021-05-06 DIAGNOSIS — M25572 Pain in left ankle and joints of left foot: Secondary | ICD-10-CM | POA: Diagnosis not present

## 2021-05-06 DIAGNOSIS — M21542 Acquired clubfoot, left foot: Secondary | ICD-10-CM | POA: Diagnosis not present

## 2021-05-06 DIAGNOSIS — M2012 Hallux valgus (acquired), left foot: Secondary | ICD-10-CM | POA: Diagnosis not present

## 2021-05-06 MED ORDER — CLINDAMYCIN HCL 300 MG PO CAPS: 300.0000 mg | ORAL_CAPSULE | Freq: Three times a day (TID) | ORAL | 0 refills | Status: DC

## 2021-05-06 MED ORDER — OXYCODONE-ACETAMINOPHEN 5-325 MG PO TABS: 1.0000 | ORAL_TABLET | Freq: Four times a day (QID) | ORAL | 0 refills | Status: DC | PRN

## 2021-05-06 MED ORDER — ONDANSETRON HCL 4 MG PO TABS: 4.0000 mg | ORAL_TABLET | Freq: Three times a day (TID) | ORAL | 0 refills | Status: DC | PRN

## 2021-05-06 MED ORDER — IBUPROFEN 800 MG PO TABS: 800.0000 mg | ORAL_TABLET | Freq: Three times a day (TID) | ORAL | 0 refills | Status: DC | PRN

## 2021-05-06 NOTE — Progress Notes (Signed)
Postop medications sent 

## 2021-05-07 ENCOUNTER — Telehealth: Payer: Self-pay | Admitting: *Deleted

## 2021-05-07 NOTE — Telephone Encounter (Signed)
Patient is calling because she is having a hard time keeping down pain medicine, has thrown up a few times, pain is not going away,level is  still at 8 1/2. Please advise.

## 2021-05-11 ENCOUNTER — Telehealth: Payer: Self-pay | Admitting: *Deleted

## 2021-05-11 ENCOUNTER — Encounter: Payer: BC Managed Care – PPO | Admitting: Podiatry

## 2021-05-11 ENCOUNTER — Other Ambulatory Visit: Payer: Self-pay

## 2021-05-11 ENCOUNTER — Ambulatory Visit (INDEPENDENT_AMBULATORY_CARE_PROVIDER_SITE_OTHER): Payer: BC Managed Care – PPO

## 2021-05-11 ENCOUNTER — Ambulatory Visit (INDEPENDENT_AMBULATORY_CARE_PROVIDER_SITE_OTHER): Payer: BC Managed Care – PPO | Admitting: Podiatry

## 2021-05-11 DIAGNOSIS — Z9889 Other specified postprocedural states: Secondary | ICD-10-CM

## 2021-05-11 DIAGNOSIS — M21612 Bunion of left foot: Secondary | ICD-10-CM

## 2021-05-11 DIAGNOSIS — M2042 Other hammer toe(s) (acquired), left foot: Secondary | ICD-10-CM

## 2021-05-11 NOTE — Telephone Encounter (Signed)
Called patient on Friday 05-08-2021 and stated that I was calling to see how the patient was doing after having surgery with Dr Jacqualyn Posey and patient stated that she was doing better than she was the day before and the medicine was helping and that she was icing and elevating and asked if she could take the boot off when elevating and I stated that she could but had to wear it to sleep and stated to call the office if any concerns or questions arise. Lattie Haw

## 2021-05-15 NOTE — Progress Notes (Signed)
Subjective: Leah Cuevas is a 60 y.o. is seen today in office s/p left foot Lapidus, Akin, second metatarsal shortening osteotomy and hammertoe repair preformed on 05/06/2021.  She states her pain is improving she is only taking the pain medication as needed to cause nausea.  She has been nonweightbearing using the cam boot.  No recent injury or new concerns today.  Denies any fevers or chills.    Objective: General: No acute distress, AAOx3  DP/PT pulses palpable 2/4, CRT < 3 sec to all digits.  Protective sensation intact. Motor function intact.  Left foot: Incision is well coapted without any evidence of dehiscence. There is no surrounding erythema, ascending cellulitis, fluctuance, crepitus, malodor, drainage/purulence.  There is 1 small blister just adjacent to the incision on the MPJ which I drained today as well as clear fluid was expressed with no purulence.  There is no erythema, ascending cellulitis or signs of infection.  There is mild edema around the surgical site. There is no significant pain along the surgical site.  No other areas of tenderness to bilateral lower extremities.  No other open lesions or pre-ulcerative lesions.  No pain with calf compression, swelling, warmth, erythema.   Assessment and Plan:  Status post left foot surgery, doing well with no complications   -Treatment options discussed including all alternatives, risks, and complications -X-rays obtained and reviewed.  Status post bunionectomy, hammertoe, shortening osteotomy without any complicating factors and hardware intact. -I cleaned the blister area with alcohol and I did drain this.  No signs of infection or any clear drainage. -Amount of antibiotic ointment was applied to the incision followed by dry sterile dressing.  Keep dressing clean, dry, intact. -Continue nonweightbearing -Ice/elevation -Pain medication as needed. -Monitor for any clinical signs or symptoms of infection and DVT/PE and directed  to call the office immediately should any occur or go to the ER. -Follow-up as scheduled or sooner if any problems arise. In the meantime, encouraged to call the office with any questions, concerns, change in symptoms.   Celesta Gentile, DPM

## 2021-05-21 ENCOUNTER — Encounter: Payer: BC Managed Care – PPO | Admitting: Podiatry

## 2021-05-21 ENCOUNTER — Ambulatory Visit (INDEPENDENT_AMBULATORY_CARE_PROVIDER_SITE_OTHER): Payer: BC Managed Care – PPO | Admitting: Podiatry

## 2021-05-21 ENCOUNTER — Other Ambulatory Visit: Payer: Self-pay

## 2021-05-21 DIAGNOSIS — Z9889 Other specified postprocedural states: Secondary | ICD-10-CM

## 2021-05-21 DIAGNOSIS — M2042 Other hammer toe(s) (acquired), left foot: Secondary | ICD-10-CM

## 2021-05-21 DIAGNOSIS — M21612 Bunion of left foot: Secondary | ICD-10-CM

## 2021-05-25 NOTE — Progress Notes (Signed)
Subjective: Leah Cuevas is a 60 y.o. is seen today in office s/p left foot Lapidus, Akin, second metatarsal shortening osteotomy and hammertoe repair preformed on 05/06/2021.  States that she has been doing well still not taking any pain medication.  She has been in the cam boot, nonweightbearing.  She presents today for possible suture removal.  No other concerns today.  No fevers or chills.    Objective: General: No acute distress, AAOx3  DP/PT pulses palpable 2/4, CRT < 3 sec to all digits.  Protective sensation intact. Motor function intact.  Left foot: Incision is well coapted without any evidence of dehiscence.  K wire intact the second toe.  There is minimal edema to the foot and there is no erythema or warmth there is no drainage or pus or any cellulitis.  No obvious signs of infection noted today.  Toes are in rectus position.  The area was had a blister adjacent to the MPJ has scabbed over.  No new blister formation identified today. No other open lesions or pre-ulcerative lesions.  No pain with calf compression, swelling, warmth, erythema.   Assessment and Plan:  Status post left foot surgery, doing well with no complications   -Treatment options discussed including all alternatives, risks, and complications -I remove the sutures and the second digit hammertoe, ostomy site without any complications incisions well coapted.  Steri-Strips applied for reinforcement.  The sutures on the bunion site are absorbable. -Small amount of antibiotic ointment was applied followed by dressing.  She can wash the foot with soap and water and dry thoroughly apply a similar bandage.  Hold off on soaking the foot or showering. -Continue nonweightbearing. -Ice/elevation -Pain medication as needed. -Monitor for any clinical signs or symptoms of infection and DVT/PE and directed to call the office immediately should any occur or go to the ER. -Follow-up as scheduled or sooner if any problems arise. In  the meantime, encouraged to call the office with any questions, concerns, change in symptoms.   X-ray next appointment  Celesta Gentile, DPM

## 2021-05-29 ENCOUNTER — Other Ambulatory Visit (HOSPITAL_COMMUNITY): Payer: Self-pay | Admitting: Adult Health Nurse Practitioner

## 2021-05-29 DIAGNOSIS — E559 Vitamin D deficiency, unspecified: Secondary | ICD-10-CM | POA: Diagnosis not present

## 2021-05-29 DIAGNOSIS — Z136 Encounter for screening for cardiovascular disorders: Secondary | ICD-10-CM | POA: Diagnosis not present

## 2021-05-29 DIAGNOSIS — Z0000 Encounter for general adult medical examination without abnormal findings: Secondary | ICD-10-CM | POA: Diagnosis not present

## 2021-05-29 DIAGNOSIS — D509 Iron deficiency anemia, unspecified: Secondary | ICD-10-CM | POA: Diagnosis not present

## 2021-05-29 DIAGNOSIS — Z1231 Encounter for screening mammogram for malignant neoplasm of breast: Secondary | ICD-10-CM

## 2021-05-29 DIAGNOSIS — Z13220 Encounter for screening for lipoid disorders: Secondary | ICD-10-CM | POA: Diagnosis not present

## 2021-05-29 DIAGNOSIS — E0590 Thyrotoxicosis, unspecified without thyrotoxic crisis or storm: Secondary | ICD-10-CM | POA: Diagnosis not present

## 2021-05-29 DIAGNOSIS — E039 Hypothyroidism, unspecified: Secondary | ICD-10-CM | POA: Diagnosis not present

## 2021-06-04 ENCOUNTER — Encounter: Payer: BC Managed Care – PPO | Admitting: Podiatry

## 2021-06-05 ENCOUNTER — Other Ambulatory Visit: Payer: Self-pay

## 2021-06-05 ENCOUNTER — Ambulatory Visit (HOSPITAL_COMMUNITY)
Admission: RE | Admit: 2021-06-05 | Discharge: 2021-06-05 | Disposition: A | Discharge status: Home / Self Care | Payer: BC Managed Care – PPO | Source: Ambulatory Visit | Attending: Adult Health Nurse Practitioner | Admitting: Adult Health Nurse Practitioner

## 2021-06-05 DIAGNOSIS — Z1231 Encounter for screening mammogram for malignant neoplasm of breast: Secondary | ICD-10-CM | POA: Insufficient documentation

## 2021-06-08 ENCOUNTER — Ambulatory Visit (INDEPENDENT_AMBULATORY_CARE_PROVIDER_SITE_OTHER): Payer: BC Managed Care – PPO | Admitting: Podiatry

## 2021-06-08 ENCOUNTER — Ambulatory Visit (INDEPENDENT_AMBULATORY_CARE_PROVIDER_SITE_OTHER): Payer: BC Managed Care – PPO

## 2021-06-08 ENCOUNTER — Encounter: Payer: Self-pay | Admitting: Podiatry

## 2021-06-08 ENCOUNTER — Other Ambulatory Visit: Payer: Self-pay

## 2021-06-08 DIAGNOSIS — Z9889 Other specified postprocedural states: Secondary | ICD-10-CM

## 2021-06-08 DIAGNOSIS — M2042 Other hammer toe(s) (acquired), left foot: Secondary | ICD-10-CM

## 2021-06-08 DIAGNOSIS — M21612 Bunion of left foot: Secondary | ICD-10-CM | POA: Diagnosis not present

## 2021-06-15 NOTE — Progress Notes (Signed)
Subjective: Leah Cuevas is a 60 y.o. is seen today in office s/p left foot Lapidus, Akin, second metatarsal shortening osteotomy and hammertoe repair preformed on 05/06/2021.  States that she is ready get the pain out.  Otherwise she has been doing well and pain is controlled.  No fevers or chills that she reports.  She has no other concerns.   Objective: General: No acute distress, AAOx3  DP/PT pulses palpable 2/4, CRT < 3 sec to all digits.  Protective sensation intact. Motor function intact.  Left foot: Incision is well coapted without any evidence of dehiscence.  K wire intact the second toe.  The toe is somewhat rotated in a lateral direction.  However the toes are rectus position otherwise.  Minimal edema.  There is no erythema or warmth.  No drainage or erythema or any signs of infection. No other open lesions or pre-ulcerative lesions.  No pain with calf compression, swelling, warmth, erythema.   Assessment and Plan:  Status post left foot surgery, doing well with no complications   -Treatment options discussed including all alternatives, risks, and complications -X-rays obtained reviewed.  Hardware intact with any complicating factors otherwise.  No evidence of acute fracture. -Today K wire and second toe was removed.  Once remove the K wire the toe was already going back into a rectus position.  Discussed range of motion exercises to help with this as well.  Continue cam boot, elevation.  Ice daily as well as Ace bandage to help with any postoperative edema. -Ice/elevation -Pain medication as needed. -Monitor for any clinical signs or symptoms of infection and DVT/PE and directed to call the office immediately should any occur or go to the ER. -Follow-up as scheduled or sooner if any problems arise. In the meantime, encouraged to call the office with any questions, concerns, change in symptoms.   X-ray next appointment  Celesta Gentile, DPM

## 2021-06-22 ENCOUNTER — Ambulatory Visit (INDEPENDENT_AMBULATORY_CARE_PROVIDER_SITE_OTHER): Payer: BC Managed Care – PPO | Admitting: Podiatry

## 2021-06-22 ENCOUNTER — Other Ambulatory Visit: Payer: Self-pay

## 2021-06-22 ENCOUNTER — Ambulatory Visit (INDEPENDENT_AMBULATORY_CARE_PROVIDER_SITE_OTHER): Payer: BC Managed Care – PPO

## 2021-06-22 DIAGNOSIS — M21612 Bunion of left foot: Secondary | ICD-10-CM

## 2021-06-22 DIAGNOSIS — Z9889 Other specified postprocedural states: Secondary | ICD-10-CM | POA: Diagnosis not present

## 2021-06-22 DIAGNOSIS — M2042 Other hammer toe(s) (acquired), left foot: Secondary | ICD-10-CM

## 2021-06-25 NOTE — Progress Notes (Signed)
Subjective: Leah Cuevas is a 60 y.o. is seen today in office s/p left foot Lapidus, Akin, second metatarsal shortening osteotomy and hammertoe repair preformed on 05/06/2021.  States that she is doing better.  She does have some discomfort.  She has been wearing the cam boot.  No recent injury or falls or any changes otherwise.  No fevers or chills that she reports.  She has no other concerns today.  Objective: General: No acute distress, AAOx3  DP/PT pulses palpable 2/4, CRT < 3 sec to all digits.  Protective sensation intact. Motor function intact.  Left foot: Incision is well coapted without any evidence of dehiscence.  There is mild edema but there is no erythema or warmth.  There is no open sore or any signs of infection.  The second toe does sit slightly dorsiflexed at the level of the MPJ scar tissue.  No significant pain otherwise.  MMT 5/5. No pain with calf compression, swelling, warmth, erythema.   Assessment and Plan:  Status post left foot surgery, doing well with no complications   -Treatment options discussed including all alternatives, risks, and complications -Repeat x-rays obtained and reviewed.  Hardware intact any complicating factors.  Mild dorsiflexion of the second digit noted on the lateral view. -At this point we will start physical therapy.  She can still remain in the cam boot, weight-bear as tolerated.  As she starts physical therapy she can then transition to regular shoe as tolerated.  Dispensed a splint to help hold the toe down, second digit.  Continue to ice and elevate to help with any postoperative edema.  Return in about 4 weeks (around 07/20/2021). X-ray next appointment  Trula Slade DPM

## 2021-06-30 DIAGNOSIS — M25572 Pain in left ankle and joints of left foot: Secondary | ICD-10-CM | POA: Diagnosis not present

## 2021-06-30 DIAGNOSIS — R2689 Other abnormalities of gait and mobility: Secondary | ICD-10-CM | POA: Diagnosis not present

## 2021-06-30 DIAGNOSIS — M25672 Stiffness of left ankle, not elsewhere classified: Secondary | ICD-10-CM | POA: Diagnosis not present

## 2021-06-30 DIAGNOSIS — Z4789 Encounter for other orthopedic aftercare: Secondary | ICD-10-CM | POA: Diagnosis not present

## 2021-07-02 DIAGNOSIS — Z4789 Encounter for other orthopedic aftercare: Secondary | ICD-10-CM | POA: Diagnosis not present

## 2021-07-02 DIAGNOSIS — R2689 Other abnormalities of gait and mobility: Secondary | ICD-10-CM | POA: Diagnosis not present

## 2021-07-02 DIAGNOSIS — M25672 Stiffness of left ankle, not elsewhere classified: Secondary | ICD-10-CM | POA: Diagnosis not present

## 2021-07-02 DIAGNOSIS — M25572 Pain in left ankle and joints of left foot: Secondary | ICD-10-CM | POA: Diagnosis not present

## 2021-07-05 DIAGNOSIS — M25572 Pain in left ankle and joints of left foot: Secondary | ICD-10-CM | POA: Diagnosis not present

## 2021-07-05 DIAGNOSIS — R2689 Other abnormalities of gait and mobility: Secondary | ICD-10-CM | POA: Diagnosis not present

## 2021-07-05 DIAGNOSIS — M25672 Stiffness of left ankle, not elsewhere classified: Secondary | ICD-10-CM | POA: Diagnosis not present

## 2021-07-05 DIAGNOSIS — Z4789 Encounter for other orthopedic aftercare: Secondary | ICD-10-CM | POA: Diagnosis not present

## 2021-07-07 DIAGNOSIS — Z4789 Encounter for other orthopedic aftercare: Secondary | ICD-10-CM | POA: Diagnosis not present

## 2021-07-07 DIAGNOSIS — R2689 Other abnormalities of gait and mobility: Secondary | ICD-10-CM | POA: Diagnosis not present

## 2021-07-07 DIAGNOSIS — M25672 Stiffness of left ankle, not elsewhere classified: Secondary | ICD-10-CM | POA: Diagnosis not present

## 2021-07-07 DIAGNOSIS — M25572 Pain in left ankle and joints of left foot: Secondary | ICD-10-CM | POA: Diagnosis not present

## 2021-07-08 DIAGNOSIS — Z4789 Encounter for other orthopedic aftercare: Secondary | ICD-10-CM | POA: Diagnosis not present

## 2021-07-08 DIAGNOSIS — R2689 Other abnormalities of gait and mobility: Secondary | ICD-10-CM | POA: Diagnosis not present

## 2021-07-08 DIAGNOSIS — M25672 Stiffness of left ankle, not elsewhere classified: Secondary | ICD-10-CM | POA: Diagnosis not present

## 2021-07-08 DIAGNOSIS — M25572 Pain in left ankle and joints of left foot: Secondary | ICD-10-CM | POA: Diagnosis not present

## 2021-07-14 DIAGNOSIS — R2689 Other abnormalities of gait and mobility: Secondary | ICD-10-CM | POA: Diagnosis not present

## 2021-07-14 DIAGNOSIS — M25572 Pain in left ankle and joints of left foot: Secondary | ICD-10-CM | POA: Diagnosis not present

## 2021-07-14 DIAGNOSIS — M25672 Stiffness of left ankle, not elsewhere classified: Secondary | ICD-10-CM | POA: Diagnosis not present

## 2021-07-14 DIAGNOSIS — Z4789 Encounter for other orthopedic aftercare: Secondary | ICD-10-CM | POA: Diagnosis not present

## 2021-07-21 ENCOUNTER — Ambulatory Visit (INDEPENDENT_AMBULATORY_CARE_PROVIDER_SITE_OTHER): Payer: BC Managed Care – PPO | Admitting: Podiatry

## 2021-07-21 ENCOUNTER — Ambulatory Visit (INDEPENDENT_AMBULATORY_CARE_PROVIDER_SITE_OTHER): Payer: BC Managed Care – PPO

## 2021-07-21 ENCOUNTER — Other Ambulatory Visit: Payer: Self-pay

## 2021-07-21 ENCOUNTER — Encounter: Payer: Self-pay | Admitting: Podiatry

## 2021-07-21 DIAGNOSIS — M2042 Other hammer toe(s) (acquired), left foot: Secondary | ICD-10-CM | POA: Diagnosis not present

## 2021-07-21 DIAGNOSIS — M2012 Hallux valgus (acquired), left foot: Secondary | ICD-10-CM | POA: Diagnosis not present

## 2021-07-21 DIAGNOSIS — Z9889 Other specified postprocedural states: Secondary | ICD-10-CM

## 2021-07-24 NOTE — Progress Notes (Signed)
Subjective: Leah Cuevas is a 60 y.o. is seen today in office s/p left foot Lapidus, Akin, second metatarsal shortening osteotomy and hammertoe repair preformed on 05/06/2021.  States that she has been improving.  She is able to walk does not have her normal speed she reports.  No significant pain.  No recent injury or falls.  No other concerns.  Denies any fevers or chills.  Objective: General: No acute distress, AAOx3  DP/PT pulses palpable 2/4, CRT < 3 sec to all digits.  Protective sensation intact. Motor function intact.  Left foot: Incision is well coapted without any evidence of dehiscence.  Scars are well formed.  There is trace edema there is no erythema or warmth.  No significant tenderness palpation on surgical sites.  She presents today wearing regular shoe.  MMT 5/5. No pain with calf compression, swelling, warmth, erythema.   Assessment and Plan:  Status post left foot surgery, doing well with no complications   -Treatment options discussed including all alternatives, risks, and complications -X-rays obtained reviewed.  Hardware intact -Repeat x-rays obtained and reviewed.  Hardware intact any complicating factors.  Increased consolidation noted across the arthrodesis site.  No evidence of acute fracture. -Continue with supportive shoe gear.  Continue with gradual increase activity level as tolerated.  Continue rehab, exercises.  Return in about 2 months (around 09/21/2021).  Trula Slade DPM

## 2021-07-28 DIAGNOSIS — M25672 Stiffness of left ankle, not elsewhere classified: Secondary | ICD-10-CM | POA: Diagnosis not present

## 2021-07-28 DIAGNOSIS — R2689 Other abnormalities of gait and mobility: Secondary | ICD-10-CM | POA: Diagnosis not present

## 2021-07-28 DIAGNOSIS — M25572 Pain in left ankle and joints of left foot: Secondary | ICD-10-CM | POA: Diagnosis not present

## 2021-07-28 DIAGNOSIS — Z4789 Encounter for other orthopedic aftercare: Secondary | ICD-10-CM | POA: Diagnosis not present

## 2021-08-11 DIAGNOSIS — Z4789 Encounter for other orthopedic aftercare: Secondary | ICD-10-CM | POA: Diagnosis not present

## 2021-08-11 DIAGNOSIS — M25672 Stiffness of left ankle, not elsewhere classified: Secondary | ICD-10-CM | POA: Diagnosis not present

## 2021-08-11 DIAGNOSIS — M25572 Pain in left ankle and joints of left foot: Secondary | ICD-10-CM | POA: Diagnosis not present

## 2021-08-11 DIAGNOSIS — R2689 Other abnormalities of gait and mobility: Secondary | ICD-10-CM | POA: Diagnosis not present

## 2021-11-26 ENCOUNTER — Ambulatory Visit
Admission: EM | Admit: 2021-11-26 | Discharge: 2021-11-26 | Disposition: A | Discharge status: Home / Self Care | Payer: BC Managed Care – PPO | Attending: Family Medicine | Admitting: Family Medicine

## 2021-11-26 ENCOUNTER — Encounter: Payer: Self-pay | Admitting: Emergency Medicine

## 2021-11-26 ENCOUNTER — Other Ambulatory Visit: Payer: Self-pay

## 2021-11-26 DIAGNOSIS — M25461 Effusion, right knee: Secondary | ICD-10-CM

## 2021-11-26 MED ORDER — DEXAMETHASONE SODIUM PHOSPHATE 10 MG/ML IJ SOLN
10.0000 mg | Freq: Once | INTRAMUSCULAR | Status: AC
  Administered 2021-11-26: 10 mg via INTRAMUSCULAR

## 2021-11-26 NOTE — ED Provider Notes (Signed)
?[facility] ? ? ? ?CSN: [id number] ?Arrival date & time: 11/26/21  0808 ? ? ?  ? ?History   ?Chief Complaint ?Chief Complaint  ?Patient presents with  ? Knee Pain  ? ? ?HPI ?Leah Cuevas is a 61 y.o. female.  ? ?Presenting today with 1 day history of right knee swelling, mild discomfort.  Denies any injury, states she worked out like usual yesterday and then while she was sitting at lunch noticed that the knee felt like it was swelling.  She states cold seems to make it worse so she has not been doing that, has been using homeopathic rubs to the knee with no relief.  Has a history of known arthritis to the left knee.  Denies weakness, numbness, tingling, discoloration, radiation of pain. ? ? ?Past Medical History:  ?Diagnosis Date  ? Allergy   ? Anemia   ? Esophagitis   ? 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 Hyperplastic polyp of intestine   ? Non-celiac gluten sensitivity   ? Rotator cuff impingement syndrome   ? Thyroid disease   ? Vaginal Pap smear, abnormal   ? ? ?Patient Active Problem List  ? Diagnosis Date Noted  ? History of Helicobacter pylori infection 11/20/2020  ? 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 Screening for colorectal cancer 06/21/2019  ? Encounter for gynecological examination with Papanicolaou smear of cervix 06/21/2019  ? Left lower quadrant abdominal tenderness without rebound tenderness 06/21/2019  ? Small intestinal bacterial overgrowth 06/01/2016  ? H. pylori infection 01/19/2016  ? GERD (gastroesophageal reflux disease) 01/19/2016  ? Dyspepsia 08/20/2015  ? History of adenomatous polyp of colon 08/20/2015  ? Anemia, iron deficiency 06/26/2015  ? Bilateral dry eyes 06/26/2015  ? Pinguecula of both eyes 06/26/2015  ? Rash and nonspecific skin eruption 05/21/2015  ? Actinic keratosis of left cheek 05/21/2015  ? Ankle edema 02/06/2015  ? Dyspareunia 02/05/2015  ? Foot pain, left 07/08/2014  ? Unspecified visual disturbance  01/30/2014  ? New onset tinnitus of right ear 10/29/2013  ? Hyperplastic polyp of intestine 12/09/2011  ? Vitamin D deficiency disease 12/09/2011  ? 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 Hypothyroidism 10/22/2010  ? ANEMIA, HX OF 04/01/2008  ? ESOPHAGITIS, UNSPECIFIED 10/13/2006  ? ? ?Past Surgical History:  ?Procedure Laterality Date  ? APPENDECTOMY    ? BACTERIAL OVERGROWTH TEST N/A 02/10/2016  ? Procedure: BACTERIAL OVERGROWTH TEST;  Surgeon: Danie Binder, MD;  Location: [facility];  Service: Endoscopy;  Laterality: N/A;  0700  ? BACTERIAL OVERGROWTH TEST N/A 04/13/2016  ? Procedure: BACTERIAL OVERGROWTH TEST;  Surgeon: Danie Binder, MD;  Location: [facility];  Service: Endoscopy;  Laterality: N/A;  0700  ? CESAREAN SECTION  08/16/1996  ? COLONOSCOPY  2006 DB  ? HYPERPLASTIC POLYP  ? COLONOSCOPY N/A 09/12/2015  ? Procedure: COLONOSCOPY;  Surgeon: Danie Binder, MD;  Location: [facility];  Service: Endoscopy;  Laterality: N/A;  1115 ?  ? DILATION AND CURETTAGE OF UTERUS  08/17/1995  ? s/p spontaneous abortion  ? ESOPHAGOGASTRODUODENOSCOPY N/A 10/17/2015  ? Procedure: ESOPHAGOGASTRODUODENOSCOPY (EGD);  Surgeon: Danie Binder, MD;  Location: [facility];  Service: Endoscopy;  Laterality: N/A;  830 ?  ? EXPLORATORY LAPROSCOPY    ? Dx PID  ? HAMMER TOE SURGERY Left 2013  ? september  ? Tendon reattachment  08/16/1997  ? Right hand  ? ? ?OB History   ? ? Gravida  ?  3  ? Para  ?1  ? Term  ?   ? Preterm  ?   ? AB  ?2  ? Living  ?1  ?  ? ? SAB  ?   ? IAB  ?   ? Ectopic  ?   ? Multiple  ?   ? Live Births  ?   ?   ?  ?  ? ? ? ?Home Medications   ? ?Prior to Admission medications   ?Medication Sig Start Date End Date Taking? Authorizing Provider  ?Cholecalciferol (VITAMIN D-3) 125 MCG (5000 UT) TABS Take 1 tablet by mouth daily.    [provider]  ?estradiol (ESTRACE) 2 MG tablet Take 1 tablet (2 mg total) by mouth daily. 02/11/21   Doree Albee, MD  ?levothyroxine (SYNTHROID) 150 MCG  tablet Take 150 mcg by mouth daily. 07/07/21   [provider]  ?Misc Natural Products (NF FORMULAS TESTOSTERONE) CAPS Take 15 mg by mouth daily. 05/21/19   Doree Albee, MD  ?progesterone (PROMETRIUM) 200 MG capsule Take by mouth. 01/29/21   [provider]  ?Zinc 50 MG CAPS Take 1 capsule by mouth once a week.    [provider]  ? ? ?Family History ?Family History  ?Problem Relation Age of Onset  ? Coronary artery disease Mother   ? Heart Problems Brother   ? Hypertension Brother   ? Heart Problems Brother   ? Hypertension Brother   ? Colon cancer Paternal Uncle   ? Lung cancer Father   ? Coronary artery disease Sister   ? ? ?Social History ?Social History  ? ?Tobacco Use  ? Smoking status: Never  ? Smokeless tobacco: Never  ?Vaping Use  ? Vaping Use: Never used  ?Substance Use Topics  ? Alcohol use: No  ?  Alcohol/week: 0.0 standard drinks  ? Drug use: No  ? ? ? ?Allergies   ?Penicillins ? ? ?Review of Systems ?Review of Systems ?Per HPI ? ?Physical Exam ?Triage Vital Signs ?ED Triage Vitals  ?Enc Vitals Group  ?   BP 11/26/21 0844 134/71  ?   Pulse Rate 11/26/21 0844 61  ?   Resp 11/26/21 0844 16  ?   Temp 11/26/21 0844 97.9 ?F (36.6 ?C)  ?   Temp Source 11/26/21 0844 Oral  ?   SpO2 11/26/21 0844 98 %  ?   Weight 11/26/21 0856 120 lb (54.4 kg)  ?   Height 11/26/21 0856 '5\' 4"'$  (1.626 m)  ?   Head Circumference --   ?   Peak Flow --   ?   Pain Score 11/26/21 0856 8  ?   Pain Loc --   ?   Pain Edu? --   ?   Excl. in [place]? --   ? ?No data found. ? ?Updated Vital Signs ?BP 134/71 (BP Location: Right Arm)   Pulse 61   Temp 97.9 ?F (36.6 ?C) (Oral)   Resp 16   Ht '5\' 4"'$  (1.626 m)   Wt 120 lb (54.4 kg)   SpO2 98%   BMI 20.60 kg/m?  ? ?Visual Acuity ?Right Eye Distance:   ?Left Eye Distance:   ?Bilateral Distance:   ? ?Right Eye Near:   ?Left Eye Near:    ?Bilateral Near:    ? ?Physical Exam ?Vitals and nursing note reviewed.  ?Constitutional:   ?   Appearance: Normal appearance. She is  not ill-appearing.  ?HENT:  ?   Head: Atraumatic.  ?  Eyes:  ?   Extraocular Movements: Extraocular movements intact.  ?   Conjunctiva/sclera: Conjunctivae normal.  ?Cardiovascular:  ?   Rate and Rhythm: Normal rate and regular rhythm.  ?   Heart sounds: Normal heart sounds.  ?Pulmonary:  ?   Effort: Pulmonary effort is normal.  ?   Breath sounds: Normal breath sounds.  ?Musculoskeletal:     ?   General: Swelling present. No tenderness. Normal range of motion.  ?   Cervical back: Normal range of motion and neck supple.  ?   Comments: Trace edema diffusely right anterior knee.  No significant tenderness to palpation.  No crepitus with passive range of motion.  Range of motion intact.  No joint instability.  Negative McMurray's and drawer testing  ?Skin: ?   General: Skin is warm and dry.  ?   Findings: No bruising or erythema.  ?Neurological:  ?   Mental Status: She is alert and oriented to person, place, and time.  ?   Comments: Right lower extremity neurovascularly intact  ?Psychiatric:     ?   Mood and Affect: Mood normal.     ?   Thought Content: Thought content normal.     ?   Judgment: Judgment normal.  ? ?UC Treatments / Results  ?Labs ?(all labs ordered are listed, but only abnormal results are displayed) ?Labs Reviewed - No data to display ? ?EKG ? ?Radiology ?No results found. ? ?Procedures ?Procedures (including critical care time) ? ?Medications Ordered in UC ?Medications  ?dexamethasone (DECADRON) injection 10 mg (10 mg Intramuscular Given 11/26/21 0927)  ? ?Initial Impression / Assessment and Plan / UC Course  ?I have reviewed the triage vital signs and the nursing notes. ? ?Pertinent labs & imaging results that were available during my care of the patient were reviewed by me and considered in my medical decision making (see chart for details). ? ?  ? ?IM Decadron given today in clinic for knee edema.  Suspect osteoarthritis flare.  Follow-up with orthopedics if worsening or not resolving.  Discussed  supportive home care, RICE protocol ? ?Final Clinical Impressions(s) / UC Diagnoses  ? ?Final diagnoses:  ?Effusion, right knee  ? ?Discharge Instructions   ?None ?  ? ?ED Prescriptions   ?None ?  ? ?PDMP not reviewed this e

## 2021-11-26 NOTE — ED Triage Notes (Signed)
Pt reports right knee swelling recent flare-up last several days. pt reports was seen for similar by Dr. Aline Brochure in January and was told had arthritis and bone spurs in left knee.  ? ?Pt denies any known injury and reports "started swelling" last Wednesday and reports "cold" bothers it. Homeopathic medicines have not really helped with discomfort. ?

## 2021-12-07 IMAGING — MG MM DIGITAL SCREENING BILAT W/ TOMO AND CAD
8 series · 9 of 24 positions shown · non-contrast
Comparison: Previous exam(s).

CLINICAL DATA: Screening.

EXAM:
DIGITAL SCREENING BILATERAL MAMMOGRAM WITH TOMOSYNTHESIS AND CAD
TECHNIQUE: Bilateral screening digital craniocaudal and mediolateral oblique
mammograms were obtained. Bilateral screening digital breast
tomosynthesis was performed. The images were evaluated with
computer-aided detection.

[R CC synth-2D]
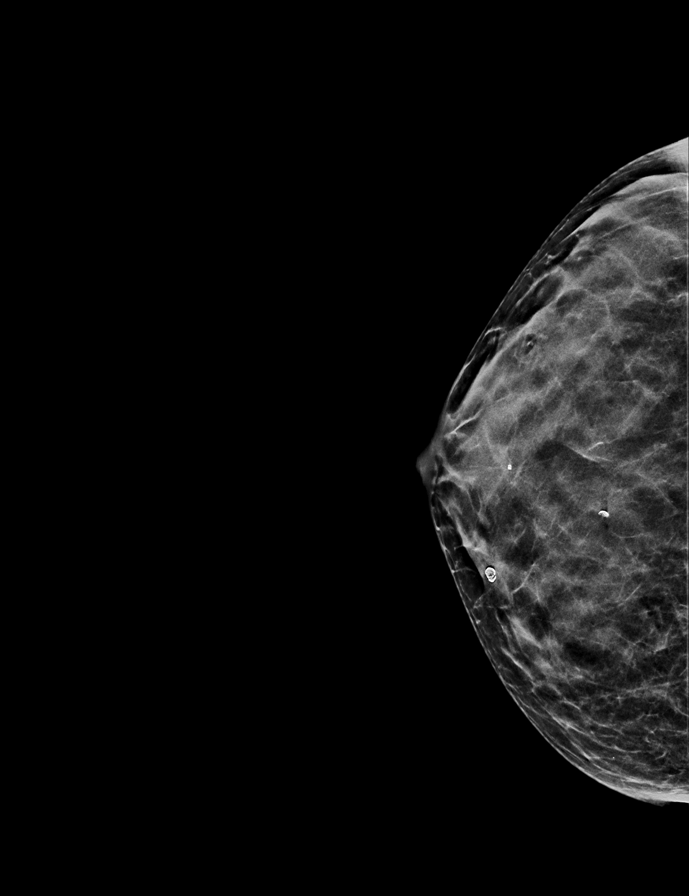

[L MLO synth-2D]
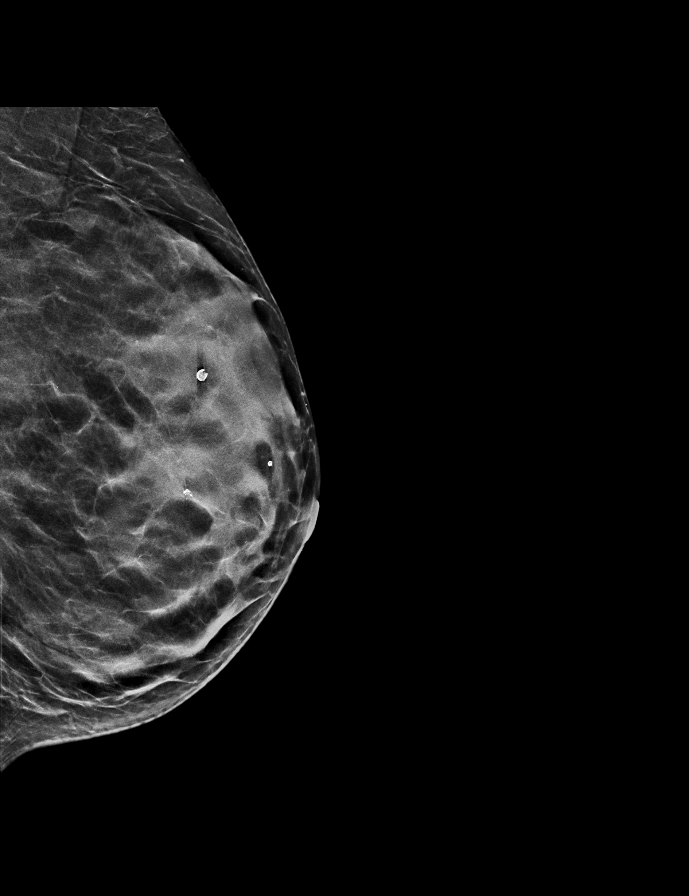

[L CC synth-2D]
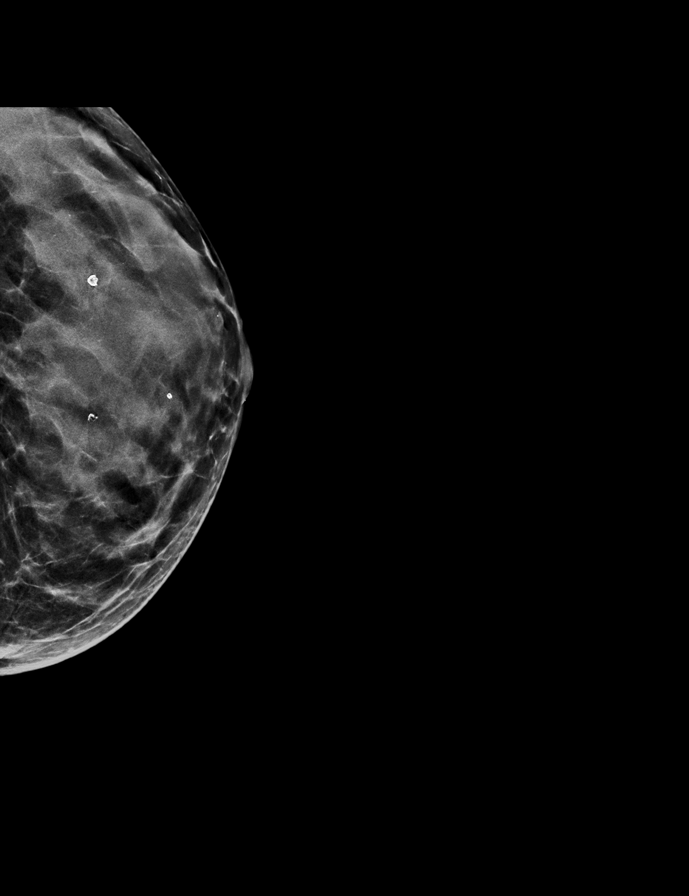

[R MLO synth-2D]
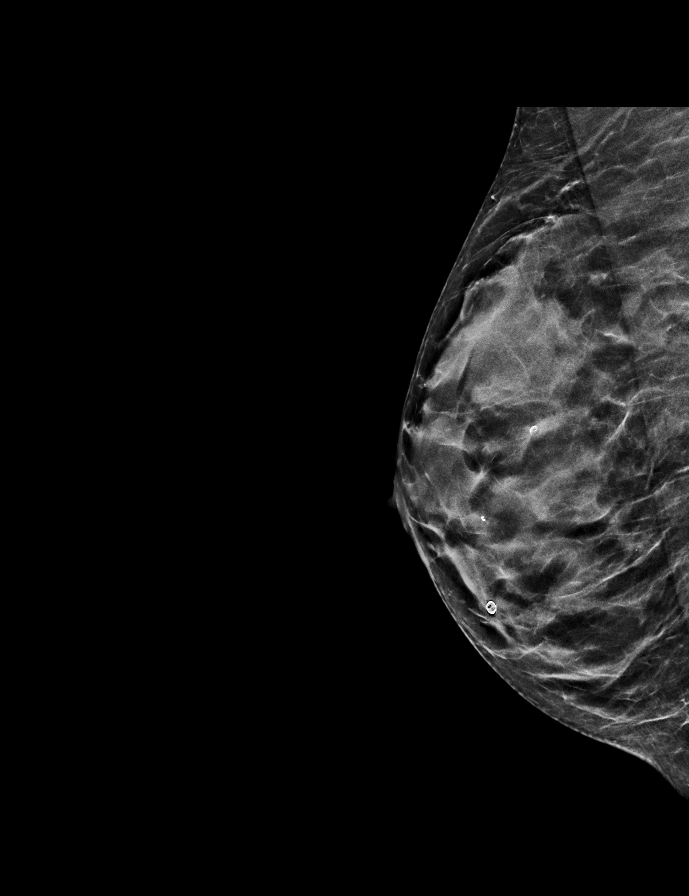

[R CC tomo · 2 of 37 frames shown]
[frame 13/37]
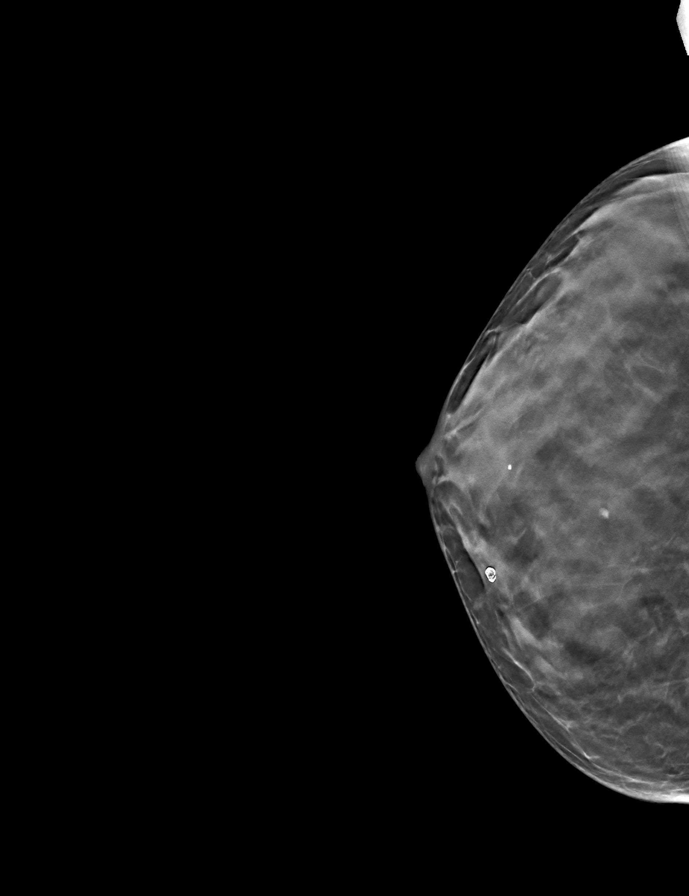
[frame 19/37]
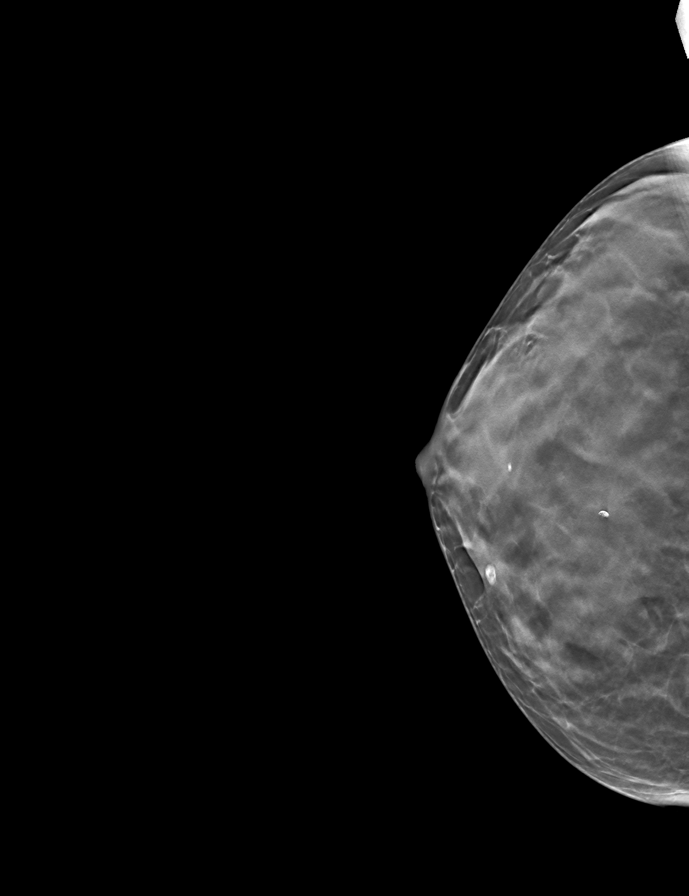

[R MLO tomo · tomo slice 23/44.0]
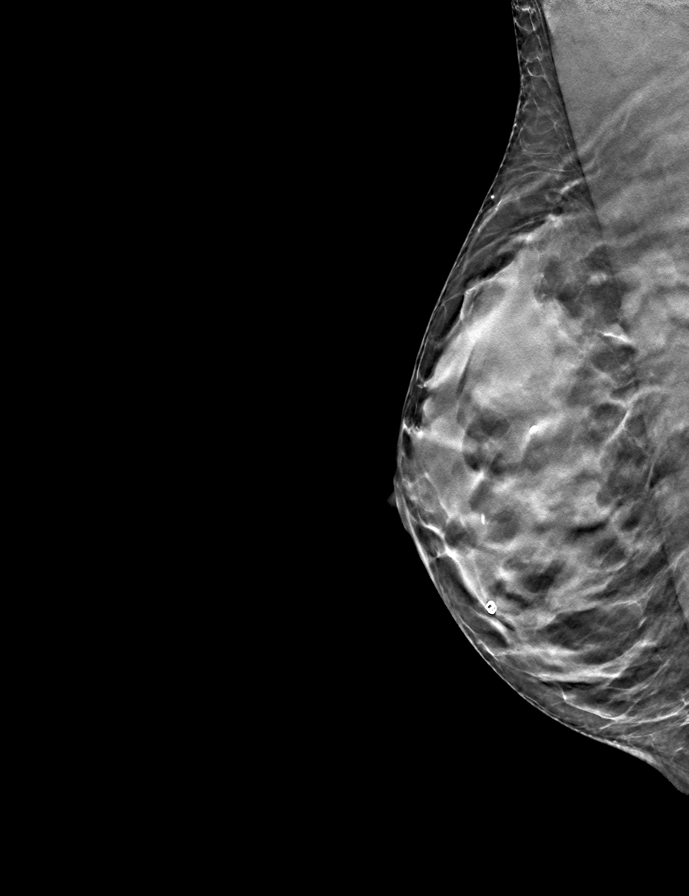

[L MLO tomo · tomo slice 23/45.0]
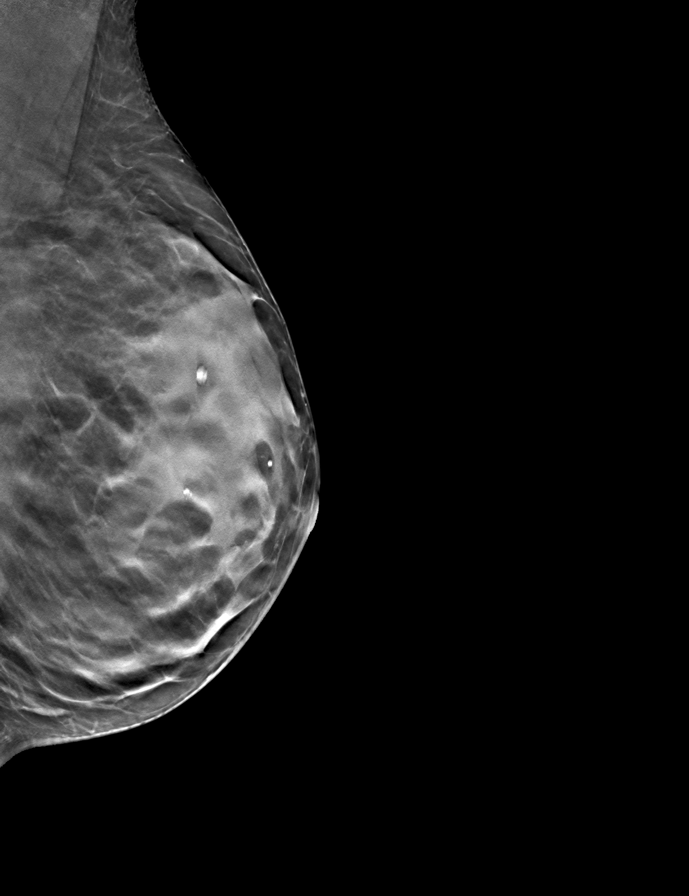

[L CC tomo · tomo slice 26/51.0]
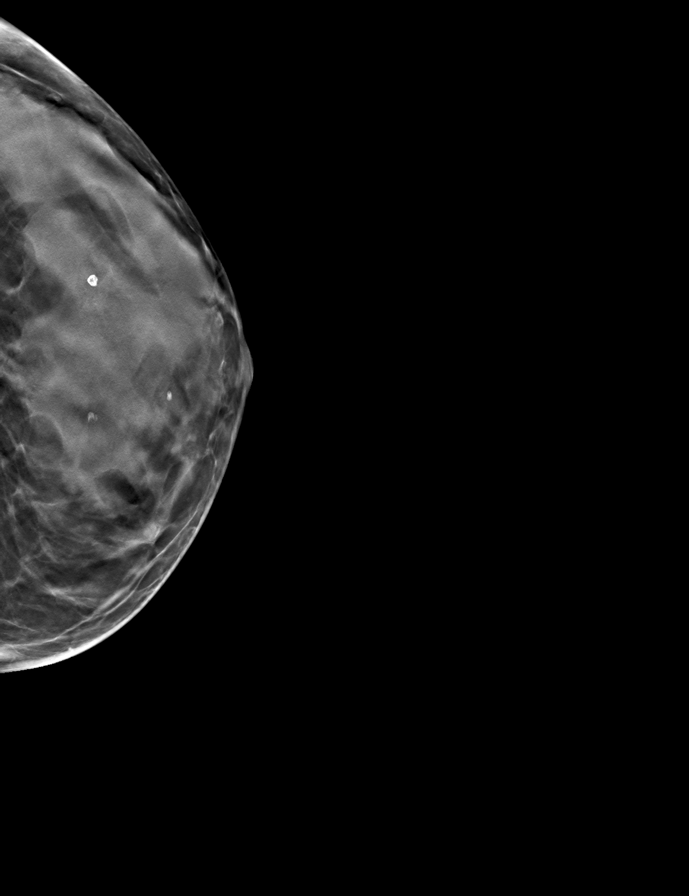

[9 of 24 positions shown; findings below may reference images not displayed]

ACR Breast Density Category d: The breast tissue is extremely dense,
which lowers the sensitivity of mammography
FINDINGS: There are no findings suspicious for malignancy.
IMPRESSION: No mammographic evidence of malignancy. A result letter of this
screening mammogram will be mailed directly to the patient.

RECOMMENDATION:
Screening mammogram in one year. (Code:TA-V-WV9)

BI-RADS CATEGORY  1: Negative.

## 2022-04-22 ENCOUNTER — Other Ambulatory Visit (HOSPITAL_COMMUNITY): Payer: Self-pay | Admitting: Adult Health Nurse Practitioner

## 2022-04-22 DIAGNOSIS — Z1231 Encounter for screening mammogram for malignant neoplasm of breast: Secondary | ICD-10-CM

## 2022-06-07 ENCOUNTER — Ambulatory Visit (HOSPITAL_COMMUNITY)
Admission: RE | Admit: 2022-06-07 | Discharge: 2022-06-07 | Disposition: A | Discharge status: Home / Self Care | Payer: BC Managed Care – PPO | Source: Ambulatory Visit | Attending: Adult Health Nurse Practitioner | Admitting: Adult Health Nurse Practitioner

## 2022-06-07 DIAGNOSIS — Z1231 Encounter for screening mammogram for malignant neoplasm of breast: Secondary | ICD-10-CM | POA: Diagnosis present

## 2022-06-10 ENCOUNTER — Other Ambulatory Visit: Payer: Self-pay | Admitting: Adult Health Nurse Practitioner

## 2022-06-10 ENCOUNTER — Ambulatory Visit: Payer: BC Managed Care – PPO | Attending: Adult Health Nurse Practitioner

## 2022-06-10 ENCOUNTER — Telehealth: Payer: Self-pay | Admitting: *Deleted

## 2022-06-10 DIAGNOSIS — I499 Cardiac arrhythmia, unspecified: Secondary | ICD-10-CM

## 2022-06-10 NOTE — Telephone Encounter (Signed)
Fax from [facility] requesting a Zio patch for irregular heart rate.

## 2022-06-11 ENCOUNTER — Other Ambulatory Visit: Payer: Self-pay | Admitting: Adult Health Nurse Practitioner

## 2022-06-11 ENCOUNTER — Other Ambulatory Visit (HOSPITAL_COMMUNITY): Payer: Self-pay | Admitting: Adult Health Nurse Practitioner

## 2022-06-11 DIAGNOSIS — R748 Abnormal levels of other serum enzymes: Secondary | ICD-10-CM

## 2022-06-14 DIAGNOSIS — I499 Cardiac arrhythmia, unspecified: Secondary | ICD-10-CM | POA: Diagnosis not present

## 2022-06-22 ENCOUNTER — Ambulatory Visit (HOSPITAL_COMMUNITY)
Admission: RE | Admit: 2022-06-22 | Discharge: 2022-06-22 | Disposition: A | Discharge status: Home / Self Care | Payer: BC Managed Care – PPO | Source: Ambulatory Visit | Attending: Adult Health Nurse Practitioner | Admitting: Adult Health Nurse Practitioner

## 2022-06-22 DIAGNOSIS — R748 Abnormal levels of other serum enzymes: Secondary | ICD-10-CM | POA: Diagnosis present

## 2022-07-27 ENCOUNTER — Encounter: Payer: Self-pay | Admitting: *Deleted

## 2022-08-13 ENCOUNTER — Encounter: Payer: Self-pay | Admitting: *Deleted

## 2022-08-13 NOTE — Patient Instructions (Signed)
  Procedure: colonoscopy  Estimated body mass index is 20.47 kg/m as calculated from the following:   Height as of this encounter: '5\' 5"'$  (1.651 m).   Weight as of this encounter: 123 lb (55.8 kg).   Have you had a colonoscopy before?  2017. Dr. Oneida Alar  Do you have family history of colon cancer?  Yes uncle  Do you have a family history of polyps? no  Previous colonoscopy with polyps removed? yes  Do you have a history colorectal cancer?   no  Are you diabetic?  no  Do you have a prosthetic or mechanical heart valve? no  Do you have a pacemaker/defibrillator?   no  Have you had endocarditis/atrial fibrillation?  no  Do you use supplemental oxygen/CPAP?  no  Have you had joint replacement within the last 12 months?  no  Do you tend to be constipated or have to use laxatives?  no   Do you have history of alcohol use? If yes, how much and how often.  no  Do you have history or are you using drugs? If yes, what do are you  using?  no  Have you ever had a stroke/heart attack?  no  Have you ever had a heart or other vascular stent placed,?no  Do you take weight loss medication? no  female patients,: have you had a hysterectomy? no                              are you post menopausal?  yes                              do you still have your menstrual cycle? no    Date of last menstrual period?   Do you take any blood-thinning medications such as: (Plavix, aspirin, Coumadin, Aggrenox, Brilinta, Xarelto, Eliquis, Pradaxa, Savaysa or Effient)? no  If yes we need the name, milligram, dosage and who is prescribing doctor:               Current Outpatient Medications  Medication Sig Dispense Refill   Cholecalciferol (VITAMIN D-3) 125 MCG (5000 UT) TABS Take 1 tablet by mouth daily.     levothyroxine (SYNTHROID) 150 MCG tablet Take 150 mcg by mouth daily.     OVER THE COUNTER MEDICATION Calm advantage 2 daily     No current facility-administered medications for this visit.     Allergies  Allergen Reactions   Penicillins     REACTION: Vomiting. No rashes or trouble breathing. Has patient had a PCN reaction causing immediate rash, facial/tongue/throat swelling, SOB or lightheadedness with hypotension: No Has patient had a PCN reaction causing severe rash involving mucus membranes or skin necrosis: No Has patient had a PCN reaction that required hospitalization No Has patient had a PCN reaction occurring within the last 10 years: No If all of the above answers are "NO", then may proceed with Cephalosporin use.

## 2022-08-20 NOTE — Progress Notes (Signed)
ASA 2. Okay to schedule. 

## 2022-08-24 ENCOUNTER — Encounter: Payer: Self-pay | Admitting: *Deleted

## 2022-08-24 MED ORDER — NA SULFATE-K SULFATE-MG SULF 17.5-3.13-1.6 GM/177ML PO SOLN: ORAL | 0 refills | Status: DC

## 2022-08-24 NOTE — Progress Notes (Signed)
Spoke with pt. Scheduled with Dr. Abbey Chatters 1/30 at 1130am. Aware will send instructions via mychart. Rx for prep sent to [facility] per request. Insurance same.

## 2022-09-01 NOTE — Progress Notes (Signed)
Questionnaire from recall list, no referral needed

## 2022-09-14 ENCOUNTER — Encounter (HOSPITAL_COMMUNITY): Payer: Self-pay

## 2022-09-14 ENCOUNTER — Ambulatory Visit (HOSPITAL_COMMUNITY): Payer: BC Managed Care – PPO | Admitting: Anesthesiology

## 2022-09-14 ENCOUNTER — Ambulatory Visit (HOSPITAL_COMMUNITY)
Admission: RE | Admit: 2022-09-14 | Discharge: 2022-09-14 | Disposition: A | Discharge status: Home / Self Care | Payer: BC Managed Care – PPO | Source: Ambulatory Visit | Attending: Internal Medicine | Admitting: Internal Medicine

## 2022-09-14 ENCOUNTER — Encounter (HOSPITAL_COMMUNITY)
Admission: RE | Disposition: A | Discharge status: Home / Self Care | Payer: Self-pay | Source: Ambulatory Visit | Attending: Internal Medicine

## 2022-09-14 ENCOUNTER — Other Ambulatory Visit: Payer: Self-pay

## 2022-09-14 DIAGNOSIS — Z09 Encounter for follow-up examination after completed treatment for conditions other than malignant neoplasm: Secondary | ICD-10-CM

## 2022-09-14 DIAGNOSIS — G709 Myoneural disorder, unspecified: Secondary | ICD-10-CM | POA: Insufficient documentation

## 2022-09-14 DIAGNOSIS — K648 Other hemorrhoids: Secondary | ICD-10-CM | POA: Insufficient documentation

## 2022-09-14 DIAGNOSIS — Z1211 Encounter for screening for malignant neoplasm of colon: Secondary | ICD-10-CM | POA: Diagnosis not present

## 2022-09-14 DIAGNOSIS — E039 Hypothyroidism, unspecified: Secondary | ICD-10-CM | POA: Diagnosis not present

## 2022-09-14 DIAGNOSIS — D122 Benign neoplasm of ascending colon: Secondary | ICD-10-CM | POA: Insufficient documentation

## 2022-09-14 DIAGNOSIS — Z86010 Personal history of colonic polyps: Secondary | ICD-10-CM | POA: Insufficient documentation

## 2022-09-14 HISTORY — PX: COLONOSCOPY WITH PROPOFOL: SHX5780

## 2022-09-14 HISTORY — PX: POLYPECTOMY: SHX5525

## 2022-09-14 SURGERY — COLONOSCOPY WITH PROPOFOL
Anesthesia: General

## 2022-09-14 MED ORDER — LACTATED RINGERS IV SOLN: INTRAVENOUS | Status: DC

## 2022-09-14 MED ORDER — LIDOCAINE HCL (CARDIAC) PF 100 MG/5ML IV SOSY
PREFILLED_SYRINGE | INTRAVENOUS | Status: DC | PRN
  Administered 2022-09-14: 40 mg via INTRATRACHEAL

## 2022-09-14 MED ORDER — PROPOFOL 10 MG/ML IV BOLUS
INTRAVENOUS | Status: DC | PRN
  Administered 2022-09-14: 20 mg via INTRAVENOUS
  Administered 2022-09-14: 30 mg via INTRAVENOUS
  Administered 2022-09-14: 100 mg via INTRAVENOUS
  Administered 2022-09-14: 50 mg via INTRAVENOUS

## 2022-09-14 NOTE — Transfer of Care (Signed)
Immediate Anesthesia Transfer of Care Note  Patient: Leah Cuevas  Procedure(s) Performed: COLONOSCOPY WITH PROPOFOL POLYPECTOMY  Patient Location: Endoscopy Unit  Anesthesia Type:General  Level of Consciousness: drowsy  Airway & Oxygen Therapy: Patient Spontanous Breathing  Post-op Assessment: Report given to RN and Post -op Vital signs reviewed and stable  Post vital signs: Reviewed and stable  Last Vitals:  Vitals Value Taken Time  BP 116/65 09/14/22 1144  Temp 36.4 C 09/14/22 1144  Pulse 88 09/14/22 1144  Resp 17 09/14/22 1144  SpO2 96 % 09/14/22 1144    Last Pain:  Vitals:   09/14/22 1144  TempSrc: Oral  PainSc:       Patients Stated Pain Goal: 8 (19/37/90 2409)  Complications: No notable events documented.

## 2022-09-14 NOTE — H&P (Signed)
Primary Care Physician:  Pablo Lawrence, NP Primary Gastroenterologist:  Dr. Abbey Chatters  Pre-Procedure History & Physical: HPI:  Leah Cuevas is a 62 y.o. 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   Hyperplastic polyp of intestine    Non-celiac gluten sensitivity    Rotator cuff impingement syndrome    Thyroid disease    Vaginal Pap smear, abnormal     Past Surgical History:  Procedure Laterality Date   APPENDECTOMY     BACTERIAL OVERGROWTH TEST N/A 02/10/2016   Procedure: BACTERIAL OVERGROWTH TEST;  Surgeon: Danie Binder, MD;  [facility]: [facility];  Service: Endoscopy;  Laterality: N/A;  0700   BACTERIAL OVERGROWTH TEST N/A 04/13/2016   Procedure: BACTERIAL OVERGROWTH TEST;  Surgeon: Danie Binder, MD;  Location: [facility];  Service: Endoscopy;  Laterality: N/A;  0700   CESAREAN SECTION  08/16/1996   COLONOSCOPY  2006 DB   HYPERPLASTIC POLYP   COLONOSCOPY N/A 09/12/2015   Procedure: COLONOSCOPY;  Surgeon: Danie Binder, MD;  Location: [facility];  Service: Endoscopy;  Laterality: N/A;  [place] OF UTERUS  08/17/1995   s/p spontaneous abortion   ESOPHAGOGASTRODUODENOSCOPY N/A 10/17/2015   Procedure: ESOPHAGOGASTRODUODENOSCOPY (EGD);  Surgeon: Danie Binder, MD;  Location: [facility];  Service: Endoscopy;  Laterality: N/A;  [place]     Dx PID   HAMMER TOE SURGERY Left 2013   september   Tendon reattachment  08/16/1997   Right hand    Prior to Admission medications   Medication Sig Start Date End Date Taking? Authorizing Provider  5-HTP CAPS Take 2 capsules by mouth at bedtime. Calm Advantage (with 5-HTP and Theanine)   Yes [provider]  acetaminophen (TYLENOL) 500 MG tablet  Take 500-1,000 mg by mouth every 6 (six) hours as needed (pain).   Yes [provider]  Cholecalciferol (VITAMIN D-3) 125 MCG (5000 UT) TABS Take 5,000 Units by mouth in the morning.   Yes [provider]  L-TRYPTOPHAN PO Take 1 capsule by mouth at bedtime.   Yes [provider]  levothyroxine (SYNTHROID) 125 MCG tablet Take 125 mcg by mouth daily before breakfast.   Yes [provider]  Na Sulfate-K Sulfate-Mg Sulf 17.5-3.13-1.6 GM/177ML SOLN As directed 08/24/22  Yes Kenleigh Toback K, DO  Valerian 450 MG CAPS Take 450 mg by mouth at bedtime.   Yes [provider]    Allergies as of 08/24/2022 - Review Complete 11/26/2021  Allergen Reaction Noted   Penicillins  04/01/2008    Family History  Problem Relation Age of Onset   Coronary artery disease Mother    Heart Problems Brother    Hypertension Brother    Heart Problems Brother    Hypertension Brother    Colon cancer Paternal Uncle    Lung cancer Father    Coronary artery disease Sister     Social History   Socioeconomic History   Marital status: Married    Spouse name: Not on file   Number of children: Not on file   Years of education: Not on file   Highest education level: Not on file  Occupational History   Not on file  Tobacco  Use   Smoking status: Never   Smokeless tobacco: Never  Vaping Use   Vaping Use: Never used  Substance and Sexual Activity   Alcohol use: No    Alcohol/week: 0.0 standard drinks of alcohol   Drug use: No   Sexual activity: Yes    Birth control/protection: Post-menopausal  Other Topics Concern   Not on file  Social History Narrative   Married since 2007.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   Social Determinants of Health   Financial Resource Strain: Not on file  Food Insecurity: Not on file  Transportation Needs: Not on file  Physical Activity: Not on file  Stress: Not on file  Social Connections: Not on file   Intimate Partner Violence: Not on file    Review of Systems: See HPI, otherwise negative ROS  Physical Exam: Vital signs in last 24 hours: Temp:  [97.5 F (36.4 C)] 97.5 F (36.4 C) (01/30 1007) Pulse Rate:  [62] 62 (01/30 1007) Resp:  [15] 15 (01/30 1007) BP: (128)/(77) 128/77 (01/30 1007) SpO2:  [100 %] 100 % (01/30 1007) Weight:  [54.4 kg] 54.4 kg (01/30 1001)   General:   Alert,  Well-developed, well-nourished, pleasant and cooperative in NAD Head:  Normocephalic and atraumatic. Eyes:  Sclera clear, no icterus.   Conjunctiva pink. Ears:  Normal auditory acuity. Nose:  No deformity, discharge,  or lesions. Msk:  Symmetrical without gross deformities. Normal posture. Extremities:  Without clubbing or edema. Neurologic:  Alert and  oriented x4;  grossly normal neurologically. Skin:  Intact without significant lesions or rashes. Psych:  Alert and cooperative. Normal mood and affect.  Impression/Plan: Leah Cuevas is here for a colonoscopy to be performed for surveillance purposes, personal history of adenomatous colon polyps in 2018  The risks of the procedure including infection, bleed, or perforation as well as benefits, limitations, alternatives and imponderables have been reviewed with the patient. Questions have been answered. All parties agreeable.

## 2022-09-14 NOTE — Anesthesia Preprocedure Evaluation (Signed)
Anesthesia Evaluation  Patient identified by MRN, date of birth, ID band Patient awake    Reviewed: Allergy & Precautions, H&P , NPO status , Patient's Chart, lab work & pertinent test results  Airway Mallampati: I  TM Distance: >3 FB Neck ROM: Full    Dental  (+) Missing   Pulmonary neg pulmonary ROS   Pulmonary exam normal breath sounds clear to auscultation       Cardiovascular negative cardio ROS Normal cardiovascular exam Rhythm:Regular Rate:Normal     Neuro/Psych  Neuromuscular disease  negative psych ROS   GI/Hepatic Neg liver ROS,GERD  Controlled,,  Endo/Other  Hypothyroidism    Renal/GU negative Renal ROS  negative genitourinary   Musculoskeletal negative musculoskeletal ROS (+)    Abdominal   Peds negative pediatric ROS (+)  Hematology  (+) Blood dyscrasia, anemia   Anesthesia Other Findings   Reproductive/Obstetrics negative OB ROS                             Anesthesia Physical Anesthesia Plan  ASA: 2  Anesthesia Plan: General   Post-op Pain Management: Minimal or no pain anticipated   Induction: Intravenous  PONV Risk Score and Plan: Propofol infusion  Airway Management Planned: Nasal Cannula and Natural Airway  Additional Equipment:   Intra-op Plan:   Post-operative Plan:   Informed Consent: I have reviewed the patients History and Physical, chart, labs and discussed the procedure including the risks, benefits and alternatives for the proposed anesthesia with the patient or authorized representative who has indicated his/her understanding and acceptance.     Dental advisory given  Plan Discussed with: CRNA and Surgeon  Anesthesia Plan Comments:        Anesthesia Quick Evaluation

## 2022-09-14 NOTE — Op Note (Signed)
[facility] Patient Name: Leah Cuevas Procedure Date: 09/14/2022 11:12 AM MRN: [id number] Date of Birth: 1961/01/29 Attending MD: Elon Alas. Abbey Chatters , [place], [phone] CSN: [id number] Age: 62 Admit Type: Outpatient Procedure:                Colonoscopy Indications:              Surveillance: Personal history of adenomatous                            polyps on last colonoscopy > 5 years ago Providers:                Elon Alas. Abbey Chatters, DO, Lambert Mody, Illene Labrador Referring MD:              Medicines:                See the Anesthesia note for documentation of the                            administered medications Complications:            No immediate complications. Estimated Blood Loss:     Estimated blood loss was minimal. Procedure:                Pre-Anesthesia Assessment:                           - The anesthesia plan was to use monitored                            anesthesia care (MAC).                           After obtaining informed consent, the colonoscope                            was passed under direct vision. Throughout the                            procedure, the patient's blood pressure, pulse, and                            oxygen saturations were monitored continuously. The                            [id number]-HQ190L ([id number]) scope was introduced through                            the anus and advanced to the the cecum, identified                            by appendiceal orifice and ileocecal valve. The                            colonoscopy was performed without difficulty.  The                            patient tolerated the procedure well. The quality                            of the bowel preparation was evaluated using the                            BBPS [facility] Bowel Preparation Scale) with scores                            of: Right Colon = 2 (minor amount of residual                            staining, small  fragments of stool and/or opaque                            liquid, but mucosa seen well), Transverse Colon = 3                            (entire mucosa seen well with no residual staining,                            small fragments of stool or opaque liquid) and Left                            Colon = 3 (entire mucosa seen well with no residual                            staining, small fragments of stool or opaque                            liquid). The total BBPS score equals 8. The quality                            of the bowel preparation was good. Scope In: 11:28:53 AM Scope Out: 11:42:11 AM Scope Withdrawal Time: 0 hours 9 minutes 42 seconds  Total Procedure Duration: 0 hours 13 minutes 18 seconds  Findings:      The perianal and digital rectal examinations were normal.      Non-bleeding internal hemorrhoids were found during endoscopy.      A 7 mm polyp was found in the ascending colon. The polyp was sessile.       The polyp was removed with a cold snare. Resection and retrieval were       complete.      The exam was otherwise without abnormality. Impression:               - Non-bleeding internal hemorrhoids.                           - One 7 mm polyp in the ascending colon, removed  with a cold snare. Resected and retrieved.                           - The examination was otherwise normal. Moderate Sedation:      Per Anesthesia Care Recommendation:           - Patient has a contact number available for                            emergencies. The signs and symptoms of potential                            delayed complications were discussed with the                            patient. Return to normal activities tomorrow.                            Written discharge instructions were provided to the                            patient.                           - Resume previous diet.                           - Continue present medications.                            - Await pathology results.                           - Repeat colonoscopy in 5 years for surveillance.                           - Return to GI clinic PRN. Procedure Code(s):        --- Professional ---                           [phone], Colonoscopy, flexible; with removal of                            tumor(s), polyp(s), or other lesion(s) by snare                            technique Diagnosis Code(s):        --- Professional ---                           Z86.010, Personal history of colonic polyps                           D12.2, Benign neoplasm of ascending colon                           K64.8, Other hemorrhoids CPT copyright  2022 American Medical Association. All rights reserved. The codes documented in this report are preliminary and upon coder review may  be revised to meet current compliance requirements. Elon Alas. Abbey Chatters, DO [place] Abbey Chatters, DO 09/14/2022 11:45:15 AM This report has been signed electronically. Number of Addenda: 0

## 2022-09-14 NOTE — Anesthesia Postprocedure Evaluation (Signed)
Anesthesia Post Note  Patient: Leah Cuevas  Procedure(s) Performed: COLONOSCOPY WITH PROPOFOL POLYPECTOMY  Patient location during evaluation: Phase II Anesthesia Type: General Level of consciousness: awake and alert and oriented Pain management: pain level controlled Vital Signs Assessment: post-procedure vital signs reviewed and stable Respiratory status: spontaneous breathing, nonlabored ventilation and respiratory function stable Cardiovascular status: blood pressure returned to baseline and stable Postop Assessment: no apparent nausea or vomiting Anesthetic complications: no  No notable events documented.   Last Vitals:  Vitals:   09/14/22 1007 09/14/22 1144  BP: 128/77 116/65  Pulse: 62 88  Resp: 15 17  Temp: (!) 36.4 C 36.4 C  SpO2: 100% 96%    Last Pain:  Vitals:   09/14/22 1147  TempSrc:   PainSc: 0-No pain                 [name]

## 2022-09-14 NOTE — Discharge Instructions (Addendum)
  Colonoscopy Discharge Instructions  Read the instructions outlined below and refer to this sheet in the next few weeks. These discharge instructions provide you with general information on caring for yourself after you leave the hospital. Your doctor may also give you specific instructions. While your treatment has been planned according to the most current medical practices available, unavoidable complications occasionally occur.   ACTIVITY You may resume your regular activity, but move at a slower pace for the next 24 hours.  Take frequent rest periods for the next 24 hours.  Walking will help get rid of the air and reduce the bloated feeling in your belly (abdomen).  No driving for 24 hours (because of the medicine (anesthesia) used during the test).   Do not sign any important legal documents or operate any machinery for 24 hours (because of the anesthesia used during the test).  NUTRITION Drink plenty of fluids.  You may resume your normal diet as instructed by your doctor.  Begin with a light meal and progress to your normal diet. Heavy or fried foods are harder to digest and may make you feel sick to your stomach (nauseated).  Avoid alcoholic beverages for 24 hours or as instructed.  MEDICATIONS You may resume your normal medications unless your doctor tells you otherwise.  WHAT YOU CAN EXPECT TODAY Some feelings of bloating in the abdomen.  Passage of more gas than usual.  Spotting of blood in your stool or on the toilet paper.  IF YOU HAD POLYPS REMOVED DURING THE COLONOSCOPY: No aspirin products for 7 days or as instructed.  No alcohol for 7 days or as instructed.  Eat a soft diet for the next 24 hours.  FINDING OUT THE RESULTS OF YOUR TEST Not all test results are available during your visit. If your test results are not back during the visit, make an appointment with your caregiver to find out the results. Do not assume everything is normal if you have not heard from your  caregiver or the medical facility. It is important for you to follow up on all of your test results.  SEEK IMMEDIATE MEDICAL ATTENTION IF: You have more than a spotting of blood in your stool.  Your belly is swollen (abdominal distention).  You are nauseated or vomiting.  You have a temperature over 101.  You have abdominal pain or discomfort that is severe or gets worse throughout the day.   Your colonoscopy revealed 1 polyp(s) which I removed successfully. Await pathology results, my office will contact you. I recommend repeating colonoscopy in 5 years for surveillance purposes. Otherwise follow up with GI as needed.   I hope you have a great rest of your week!  Elon Alas. Abbey Chatters, D.O. Gastroenterology and Hepatology [facility]

## 2022-09-15 LAB — SURGICAL PATHOLOGY

## 2022-09-16 ENCOUNTER — Other Ambulatory Visit (HOSPITAL_COMMUNITY)
Admission: RE | Admit: 2022-09-16 | Discharge: 2022-09-16 | Disposition: A | Discharge status: Home / Self Care | Payer: BC Managed Care – PPO | Source: Ambulatory Visit | Attending: Adult Health | Admitting: Adult Health

## 2022-09-16 ENCOUNTER — Ambulatory Visit (INDEPENDENT_AMBULATORY_CARE_PROVIDER_SITE_OTHER): Payer: BC Managed Care – PPO | Admitting: Adult Health

## 2022-09-16 ENCOUNTER — Encounter: Payer: Self-pay | Admitting: Adult Health

## 2022-09-16 VITALS — BP 110/68 | HR 69 | Ht 65.0 in | Wt 129.0 lb

## 2022-09-16 DIAGNOSIS — Z01419 Encounter for gynecological examination (general) (routine) without abnormal findings: Secondary | ICD-10-CM | POA: Insufficient documentation

## 2022-09-16 DIAGNOSIS — N898 Other specified noninflammatory disorders of vagina: Secondary | ICD-10-CM

## 2022-09-16 NOTE — Progress Notes (Signed)
Patient ID: Leah Cuevas, female   DOB: [birth year], 62 y.o.   MRN: [id number] History of Present Illness: Leah Cuevas is a 62 year old white female,married, PM in for a well woman gyn exam and pap.  PCP is Leah Oyster NP.   Current Medications, Allergies, Past Medical History, Past Surgical History, Family History and Social History were reviewed in [employer] record.     Review of Systems: Patient denies any headaches, hearing loss, fatigue, blurred vision, shortness of breath, chest pain, abdominal pain, problems with bowel movements, urination, or intercourse(has discomfort is dry). No joint pain or mood swings.  Denies any vaginal bleeding    Physical Exam:BP 110/68 (BP Location: Left Arm, Patient Position: Sitting, Cuff Size: Normal)   Pulse 69   Ht '5\' 5"'$  (1.651 m)   Wt 129 lb (58.5 kg)   BMI 21.47 kg/m   General:  Well developed, well nourished, no acute distress Skin:  Warm and dry Neck:  Midline trachea, normal thyroid, good ROM, no lymphadenopathy, no carotid bruits heard Lungs; Clear to auscultation bilaterally Breast:  No dominant palpable mass, retraction, or nipple discharge Cardiovascular: Regular rate and rhythm Abdomen:  Soft, non tender, no hepatosplenomegaly Pelvic:  External genitalia is normal in appearance, no lesions.  The vagina is pale with loss of moisture and rugae. Urethra has no lesions or masses. The cervix is smooth with stenotic os, pap with HR HPV genotyping performed.  Uterus is felt to be normal size, shape, and contour.  No adnexal masses or tenderness noted.Bladder is non tender, no masses felt. Rectal: Deferred had colonoscopy 09/14/22. Extremities/musculoskeletal:  No swelling or varicosities noted, no clubbing or cyanosis Psych:  No mood changes, alert and cooperative,seems happy AA is 0 Fall risk     09/16/2022    8:36 AM 08/28/2020    8:39 AM 01/08/2020    8:34 AM  Depression screen PHQ 2/9  Decreased Interest 0 0 0   Down, Depressed, Hopeless 0 0 0  PHQ - 2 Score 0 0 0  Altered sleeping 1 0   Tired, decreased energy 1 0   Change in appetite 0 0   Feeling bad or failure about yourself  0 0   Trouble concentrating 0 0   Moving slowly or fidgety/restless 0 0   Suicidal thoughts 0 0   PHQ-9 Score 2 0   Difficult doing work/chores  Not difficult at all        09/16/2022    8:36 AM  GAD 7 : Generalized Anxiety Score  Nervous, Anxious, on Edge 0  Control/stop worrying 0  Worry too much - different things 0  Trouble relaxing 0  Restless 0  Easily annoyed or irritable 1  Afraid - awful might happen 0  Total GAD 7 Score 1    Upstream - 09/16/22 0840       Pregnancy Intention Screening   Does the patient want to become pregnant in the next year? No    Does the patient's partner want to become pregnant in the next year? No    Would the patient like to discuss contraceptive options today? No      Contraception Wrap Up   Current Method No Method - Other Reason   postmenopausal   Reason for No Current Contraceptive Method at Intake ([facility] Only) Other    End Method No Method - Other Reason   postmenopausal   Contraception Counseling Provided No  Examination chaperoned by Leah Pupa LPN  Impression and Plan: 1. Encounter for gynecological examination with Papanicolaou smear of cervix Pap sent Pap in 3 years if normal Physical with PCP next year Labs with PCP Had negative mammogram 06/07/22 Had colonoscopy 09/14/22 next per GI   2. Vaginal dryness Try Luvena or Replens for vaginal moisture And continue to use lubricate with sex

## 2022-09-20 ENCOUNTER — Encounter (HOSPITAL_COMMUNITY): Payer: Self-pay | Admitting: Internal Medicine

## 2022-09-21 ENCOUNTER — Encounter: Payer: BC Managed Care – PPO | Admitting: Podiatry

## 2022-09-22 LAB — CYTOLOGY - PAP
Comment: NEGATIVE
Diagnosis: NEGATIVE
High risk HPV: NEGATIVE

## 2022-10-14 ENCOUNTER — Encounter: Payer: Self-pay | Admitting: Radiology

## 2023-01-31 ENCOUNTER — Other Ambulatory Visit (HOSPITAL_COMMUNITY): Payer: Self-pay | Admitting: Adult Health Nurse Practitioner

## 2023-01-31 DIAGNOSIS — M79662 Pain in left lower leg: Secondary | ICD-10-CM

## 2023-02-01 ENCOUNTER — Ambulatory Visit (HOSPITAL_COMMUNITY)
Admission: RE | Admit: 2023-02-01 | Discharge: 2023-02-01 | Disposition: A | Discharge status: Home / Self Care | Payer: Managed Care, Other (non HMO) | Source: Ambulatory Visit | Attending: Adult Health Nurse Practitioner | Admitting: Adult Health Nurse Practitioner

## 2023-02-01 DIAGNOSIS — M79662 Pain in left lower leg: Secondary | ICD-10-CM | POA: Diagnosis present

## 2023-05-09 ENCOUNTER — Other Ambulatory Visit (HOSPITAL_COMMUNITY): Payer: Self-pay | Admitting: Adult Health Nurse Practitioner

## 2023-05-09 DIAGNOSIS — Z1231 Encounter for screening mammogram for malignant neoplasm of breast: Secondary | ICD-10-CM

## 2023-06-10 ENCOUNTER — Ambulatory Visit (HOSPITAL_COMMUNITY)
Admission: RE | Admit: 2023-06-10 | Discharge: 2023-06-10 | Disposition: A | Discharge status: Home / Self Care | Payer: Managed Care, Other (non HMO) | Source: Ambulatory Visit | Attending: Adult Health Nurse Practitioner

## 2023-06-10 ENCOUNTER — Encounter (HOSPITAL_COMMUNITY): Payer: Self-pay

## 2023-06-10 DIAGNOSIS — Z1231 Encounter for screening mammogram for malignant neoplasm of breast: Secondary | ICD-10-CM | POA: Diagnosis present

## 2023-07-05 ENCOUNTER — Other Ambulatory Visit (HOSPITAL_COMMUNITY): Payer: Self-pay | Admitting: Family Medicine

## 2023-07-05 DIAGNOSIS — M25562 Pain in left knee: Secondary | ICD-10-CM

## 2023-07-11 ENCOUNTER — Ambulatory Visit (HOSPITAL_COMMUNITY)
Admission: RE | Admit: 2023-07-11 | Discharge: 2023-07-11 | Disposition: A | Discharge status: Home / Self Care | Payer: Managed Care, Other (non HMO) | Source: Ambulatory Visit | Attending: Family Medicine | Admitting: Family Medicine

## 2023-07-11 DIAGNOSIS — M25562 Pain in left knee: Secondary | ICD-10-CM | POA: Insufficient documentation

## 2023-09-22 ENCOUNTER — Ambulatory Visit: Payer: Self-pay | Admitting: Orthopedic Surgery

## 2023-10-25 ENCOUNTER — Encounter (HOSPITAL_COMMUNITY): Payer: Managed Care, Other (non HMO)

## 2023-11-14 ENCOUNTER — Ambulatory Visit: Payer: Self-pay | Admitting: Orthopedic Surgery

## 2023-11-14 NOTE — H&P (View-Only) (Signed)
 Leah Cuevas is an 63 y.o. female.   Chief Complaint: left knee pain HPI: Patient is here for her H&P. Patient is scheduled for a LTKA by Dr. Shelle Iron on 11/23/23 at [facility]  Dr. Shelle Iron and the patient mutually agreed to proceed with a total knee replacement. Risks and benefits of the procedure were discussed including stiffness, suboptimal range of motion, persistent pain, infection requiring removal of prosthesis and reinsertion, need for prophylactic antibiotics in the future, for example, dental procedures, possible need for manipulation, revision in the future and also anesthetic complications including DVT, PE, etc. We discussed the perioperative course, time in the hospital, postoperative recovery and the need for elevation to control swelling. We also discussed the predicted range of motion and the probability that squatting and kneeling would be unobtainable in the future. In addition, postoperative anticoagulation was discussed. We have obtained preoperative medical clearance as necessary. Provided illustrated handout and discussed it in detail. They will enroll in the total joint replacement educational forum at the hospital.  [facility] pre-op not yet scheduled.  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   Hyperplastic polyp of intestine    Non-celiac gluten sensitivity    Rotator cuff impingement syndrome    Thyroid disease    Vaginal Pap smear, abnormal     Past Surgical History:  Procedure Laterality Date   APPENDECTOMY     BACTERIAL OVERGROWTH TEST N/A 02/10/2016   Procedure: BACTERIAL OVERGROWTH TEST;  Surgeon: West Bali, MD;  [facility]: [facility];  Service: Endoscopy;  Laterality: N/A;  0700   BACTERIAL OVERGROWTH TEST N/A 04/13/2016   Procedure: BACTERIAL OVERGROWTH TEST;  Surgeon: West Bali, MD;  [facility]: [facility];  Service: Endoscopy;  Laterality: N/A;   0700   CESAREAN SECTION  08/16/1996   COLONOSCOPY  2006 DB   HYPERPLASTIC POLYP   COLONOSCOPY N/A 09/12/2015   Procedure: COLONOSCOPY;  Surgeon: West Bali, MD;  [facility]: [facility];  Service: Endoscopy;  Laterality: N/A;  1115    COLONOSCOPY WITH PROPOFOL N/A 09/14/2022   Procedure: COLONOSCOPY WITH PROPOFOL;  Surgeon: Lanelle Bal, DO;  [facility]: [facility];  Service: Endoscopy;  Laterality: N/A;  11:30am, asa 2   DILATION AND CURETTAGE OF UTERUS  08/17/1995   s/p spontaneous abortion   ESOPHAGOGASTRODUODENOSCOPY N/A 10/17/2015   Procedure: ESOPHAGOGASTRODUODENOSCOPY (EGD);  Surgeon: West Bali, MD;  [facility]: [facility];  Service: Endoscopy;  Laterality: N/A;  830    EXPLORATORY LAPROSCOPY     Dx PID   HAMMER TOE SURGERY Left 2013   september   POLYPECTOMY  09/14/2022   Procedure: POLYPECTOMY;  Surgeon: Lanelle Bal, DO;  [facility]: [facility];  Service: Endoscopy;;   Tendon reattachment  08/16/1997   Right hand    Family History  Problem Relation Age of Onset   Coronary artery disease Mother    Heart Problems Brother    Hypertension Brother    Heart Problems Brother    Hypertension Brother    Colon cancer Paternal Uncle    Lung cancer Father    Coronary artery disease Sister    Social History:  reports that she has never smoked. She has never used smokeless tobacco. She reports that she does not drink alcohol and does not use drugs.  Allergies:  Allergies  Allergen Reactions   Penicillins  Nausea And Vomiting    No rashes or trouble breathing.    Current meds: ashwagandha extract iron levothyroxine 150 mcg tablet traMADoL 50 mg tablet Vitamin D vitamin K  Review of Systems  Constitutional: Negative.   HENT: Negative.    Eyes: Negative.   Respiratory: Negative.    Cardiovascular: Negative.   Gastrointestinal: Negative.   Endocrine: Negative.   Genitourinary: Negative.   Musculoskeletal:  Positive for arthralgias, gait problem,  joint swelling and myalgias.  Hematological: Negative.     There were no vitals taken for this visit. Physical Exam Constitutional:      Appearance: Normal appearance.  HENT:     Head: Normocephalic and atraumatic.     Right Ear: External ear normal.     Left Ear: External ear normal.     Nose: Nose normal.     Mouth/Throat:     Pharynx: Oropharynx is clear.  Eyes:     Conjunctiva/sclera: Conjunctivae normal.  Cardiovascular:     Rate and Rhythm: Normal rate and regular rhythm.     Pulses: Normal pulses.     Heart sounds: Normal heart sounds.  Pulmonary:     Effort: Pulmonary effort is normal.     Breath sounds: Normal breath sounds.  Abdominal:     General: Bowel sounds are normal.  Musculoskeletal:     Cervical back: Normal range of motion.     Comments: Left knee she is got a valgus deformity is -5-1 10. Tender in the medial and lateral joint line. Passively correctable. Neurovascularly intact. No McMurray. Tenderness in the lateral and posterolateral joint. There are some fullness posteriorly  Skin:    General: Skin is warm and dry.  Neurological:     Mental Status: She is alert.    X-rays again demonstrate end-stage osteoarthrosis lateral compartment of both knees with slight valgus deformity.  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 Shelle Iron on 11/23/23. We again discussed the procedure itself as well as risks, complications and alternatives, including but not limited to DVT, PE, infx, bleeding, failure of procedure, need for secondary procedure including manipulation, nerve injury, ongoing pain/symptoms, anesthesia risk, even stroke or death. Also discussed typical post-op protocols, activity restrictions, need for PT, flexion/extension exercises, time out of work. Discussed need for DVT ppx post-op per  protocol. Discussed dental ppx and infx prevention. Also discussed limitations post-operatively such as kneeling and squatting. All questions were answered. Patient desires to proceed with surgery as scheduled.  Will hold supplements, ASA and NSAIDs accordingly. Will remain NPO after midnight the night before surgery. Will present to [facility] for pre-op testing. Anticipate hospital stay to include at least 2 midnights given medical history and to ensure proper pain control. Plan ASA for DVT ppx post-op. Plan Oxy, Robaxin, Colace, Miralax. Plan home post-op with family members at home for assistance, outpt PT in [place] with [facility]. Will follow up 10-14 days post-op for suture removal and xrays. 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 She will have the ability to ice and elevate at the office. She left FMLA paperwork with [place] today.  Plan left total knee replacement  Dorothy Spark, PA-C for Dr Shelle Iron 11/14/2023, 1:22 PM

## 2023-11-14 NOTE — H&P (Signed)
 Leah Cuevas is an 63 y.o. female.   Chief Complaint: left knee pain HPI: Patient is here for her H&P. Patient is scheduled for a LTKA by Dr. Shelle Iron on 11/23/23 at [facility]  Dr. Shelle Iron and the patient mutually agreed to proceed with a total knee replacement. Risks and benefits of the procedure were discussed including stiffness, suboptimal range of motion, persistent pain, infection requiring removal of prosthesis and reinsertion, need for prophylactic antibiotics in the future, for example, dental procedures, possible need for manipulation, revision in the future and also anesthetic complications including DVT, PE, etc. We discussed the perioperative course, time in the hospital, postoperative recovery and the need for elevation to control swelling. We also discussed the predicted range of motion and the probability that squatting and kneeling would be unobtainable in the future. In addition, postoperative anticoagulation was discussed. We have obtained preoperative medical clearance as necessary. Provided illustrated handout and discussed it in detail. They will enroll in the total joint replacement educational forum at the hospital.  [facility] pre-op not yet scheduled.  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   Hyperplastic polyp of intestine    Non-celiac gluten sensitivity    Rotator cuff impingement syndrome    Thyroid disease    Vaginal Pap smear, abnormal     Past Surgical History:  Procedure Laterality Date   APPENDECTOMY     BACTERIAL OVERGROWTH TEST N/A 02/10/2016   Procedure: BACTERIAL OVERGROWTH TEST;  Surgeon: West Bali, MD;  [facility];  Service: Endoscopy;  Laterality: N/A;  0700   BACTERIAL OVERGROWTH TEST N/A 04/13/2016   Procedure: BACTERIAL OVERGROWTH TEST;  Surgeon: West Bali, MD;  [facility];  Service: Endoscopy;  Laterality: N/A;   0700   CESAREAN SECTION  08/16/1996   COLONOSCOPY  2006 DB   HYPERPLASTIC POLYP   COLONOSCOPY N/A 09/12/2015   Procedure: COLONOSCOPY;  Surgeon: West Bali, MD;  [facility];  Service: Endoscopy;  Laterality: N/A;  1115    COLONOSCOPY WITH PROPOFOL N/A 09/14/2022   Procedure: COLONOSCOPY WITH PROPOFOL;  Surgeon: Lanelle Bal, DO;  [facility];  Service: Endoscopy;  Laterality: N/A;  11:30am, asa 2   DILATION AND CURETTAGE OF UTERUS  08/17/1995   s/p spontaneous abortion   ESOPHAGOGASTRODUODENOSCOPY N/A 10/17/2015   Procedure: ESOPHAGOGASTRODUODENOSCOPY (EGD);  Surgeon: West Bali, MD;  [facility];  Service: Endoscopy;  Laterality: N/A;  830    EXPLORATORY LAPROSCOPY     Dx PID   HAMMER TOE SURGERY Left 2013   september   POLYPECTOMY  09/14/2022   Procedure: POLYPECTOMY;  Surgeon: Lanelle Bal, DO;  [facility]: [facility];  Service: Endoscopy;;   Tendon reattachment  08/16/1997   Right hand    Family History  Problem Relation Age of Onset   Coronary artery disease Mother    Heart Problems Brother    Hypertension Brother    Heart Problems Brother    Hypertension Brother    Colon cancer Paternal Uncle    Lung cancer Father    Coronary artery disease Sister    Social History:  reports that she has never smoked. She has never used smokeless tobacco. She reports that she does not drink alcohol and does not use drugs.  Allergies:  Allergies  Allergen Reactions   Penicillins  Nausea And Vomiting    No rashes or trouble breathing.    Current meds: ashwagandha extract iron levothyroxine 150 mcg tablet traMADoL 50 mg tablet Vitamin D vitamin K  Review of Systems  Constitutional: Negative.   HENT: Negative.    Eyes: Negative.   Respiratory: Negative.    Cardiovascular: Negative.   Gastrointestinal: Negative.   Endocrine: Negative.   Genitourinary: Negative.   Musculoskeletal:  Positive for arthralgias, gait problem,  joint swelling and myalgias.  Hematological: Negative.     There were no vitals taken for this visit. Physical Exam Constitutional:      Appearance: Normal appearance.  HENT:     Head: Normocephalic and atraumatic.     Right Ear: External ear normal.     Left Ear: External ear normal.     Nose: Nose normal.     Mouth/Throat:     Pharynx: Oropharynx is clear.  Eyes:     Conjunctiva/sclera: Conjunctivae normal.  Cardiovascular:     Rate and Rhythm: Normal rate and regular rhythm.     Pulses: Normal pulses.     Heart sounds: Normal heart sounds.  Pulmonary:     Effort: Pulmonary effort is normal.     Breath sounds: Normal breath sounds.  Abdominal:     General: Bowel sounds are normal.  Musculoskeletal:     Cervical back: Normal range of motion.     Comments: Left knee she is got a valgus deformity is -5-1 10. Tender in the medial and lateral joint line. Passively correctable. Neurovascularly intact. No McMurray. Tenderness in the lateral and posterolateral joint. There are some fullness posteriorly  Skin:    General: Skin is warm and dry.  Neurological:     Mental Status: She is alert.    X-rays again demonstrate end-stage osteoarthrosis lateral compartment of both knees with slight valgus deformity.  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 Shelle Iron on 11/23/23. We again discussed the procedure itself as well as risks, complications and alternatives, including but not limited to DVT, PE, infx, bleeding, failure of procedure, need for secondary procedure including manipulation, nerve injury, ongoing pain/symptoms, anesthesia risk, even stroke or death. Also discussed typical post-op protocols, activity restrictions, need for PT, flexion/extension exercises, time out of work. Discussed need for DVT ppx post-op per  protocol. Discussed dental ppx and infx prevention. Also discussed limitations post-operatively such as kneeling and squatting. All questions were answered. Patient desires to proceed with surgery as scheduled.  Will hold supplements, ASA and NSAIDs accordingly. Will remain NPO after midnight the night before surgery. Will present to [facility] for pre-op testing. Anticipate hospital stay to include at least 2 midnights given medical history and to ensure proper pain control. Plan ASA for DVT ppx post-op. Plan Oxy, Robaxin, Colace, Miralax. Plan home post-op with family members at home for assistance, outpt PT in [place] with [facility]. Will follow up 10-14 days post-op for suture removal and xrays. 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 She will have the ability to ice and elevate at the office. She left FMLA paperwork with [place] today.  Plan left total knee replacement  Dorothy Spark, PA-C for Dr Shelle Iron 11/14/2023, 1:22 PM

## 2023-11-17 NOTE — Progress Notes (Signed)
 COVID Vaccine received:  []  No [x]  Yes Date of any COVID positive Test in last 90 days: no PCP - Roe Rutherford NP Cardiologist - n/a  Chest x-ray -  EKG -   Stress Test -  ECHO -  Cardiac Cath -   Bowel Prep - [x]  No  []   Yes ______  Pacemaker / ICD device [x]  No []  Yes   Spinal Cord Stimulator:[x]  No []  Yes       History of Sleep Apnea? [x]  No []  Yes   CPAP used?- [x]  No []  Yes    Does the patient monitor blood sugar?          [x]  No []  Yes  []  N/A  Patient has: [x]  NO Hx DM   []  Pre-DM                 []  DM1  []   DM2 Does patient have a [employer] or Dexacom? []  No []  Yes   Fasting Blood Sugar Ranges-  Checks Blood Sugar _____ times a day  GLP1 agonist / usual dose - no GLP1 instructions:  SGLT-2 inhibitors / usual dose - no SGLT-2 instructions:   Blood Thinner / Instructions:no Aspirin Instructions:no  Comments:   Activity level: Patient is able   to climb a flight of stairs without difficulty; [x]  No CP  [x]  No SOB,      Patient can  perform ADLs without assistance.   Anesthesia review:   Patient denies shortness of breath, fever, cough and chest pain at PAT appointment.  Patient verbalized understanding and agreement to the Pre-Surgical Instructions that were given to them at this PAT appointment. Patient was also educated of the need to review these PAT instructions again prior to his/her surgery.I reviewed the appropriate phone numbers to call if they have any and questions or concerns.

## 2023-11-17 NOTE — Patient Instructions (Signed)
 SURGICAL WAITING ROOM VISITATION  Patients having surgery or a procedure may have no more than 2 support people in the waiting area - these visitors may rotate.    Children under the age of 55 must have an adult with them who is not the patient.  Due to an increase in RSV and influenza rates and associated hospitalizations, children ages 47 and under may not visit patients in [facility] hospitals.  Visitors with respiratory illnesses are discouraged from visiting and should remain at home.  If the patient needs to stay at the hospital during part of their recovery, the visitor guidelines for inpatient rooms apply. Pre-op nurse will coordinate an appropriate time for 1 support person to accompany patient in pre-op.  This support person may not rotate.    Please refer to the [facility] website for the visitor guidelines for Inpatients (after your surgery is over and you are in a regular room).       Your procedure is scheduled on: 11/23/23   Report to [facility] Main Entrance    Report to admitting at 6 AM   Call this number if you have problems the morning of surgery [phone]   Do not eat food :After Midnight.   After Midnight you may have the following liquids until 5:30 AM DAY OF SURGERY  Water Non-Citrus Juices (without pulp, NO RED-Apple, White grape, White cranberry) Black Coffee (NO MILK/CREAM OR CREAMERS, sugar ok)  Clear Tea (NO MILK/CREAM OR CREAMERS, sugar ok) regular and decaf                             Plain Jell-O (NO RED)                                           Fruit ices (not with fruit pulp, NO RED)                                     Popsicles (NO RED)                                                               Sports drinks like Gatorade (NO RED)                The day of surgery:  Drink ONE (1) Pre-Surgery Clear Ensure at 5:30 AM the morning of surgery. Drink in one sitting. Do not sip.  This drink was given to you during your hospital   pre-op appointment visit. Nothing else to drink after completing the  Pre-Surgery Clear Ensure .          If yo   Oral Hygiene is also important to reduce your risk of infection.                                    Remember - BRUSH YOUR TEETH THE MORNING OF SURGERY WITH YOUR REGULAR TOOTHPASTE   Stop all vitamins and herbal supplements 7 days before surgery.  Take these medicines the morning of surgery with A SIP OF WATER: levothyroxine(synthroid), Tylenol if needed.             You may not have any metal on your body including hair pins, jewelry, and body piercing             Do not wear make-up, lotions, powders, perfumes/cologne, or deodorant  Do not wear nail polish including gel and S&S, artificial/acrylic nails, or any other type of covering on natural nails including finger and toenails. If you have artificial nails, gel coating, etc. that needs to be removed by a nail salon please have this removed prior to surgery or surgery may need to be canceled/ delayed if the surgeon/ anesthesia feels like they are unable to be safely monitored.   Do not shave  48 hours prior to surgery.    Do not bring valuables to the hospital. [place] IS NOT             RESPONSIBLE   FOR VALUABLES.   Contacts, glasses, dentures or bridgework may not be worn into surgery.   Bring small overnight bag day of surgery.   DO NOT BRING YOUR HOME MEDICATIONS TO THE HOSPITAL. PHARMACY WILL DISPENSE MEDICATIONS LISTED ON YOUR MEDICATION LIST TO YOU DURING YOUR ADMISSION IN THE HOSPITAL!    Patients discharged on the day of surgery will not be allowed to drive home.  Someone NEEDS to stay with you for the first 24 hours after anesthesia.   Special Instructions: Bring a copy of your healthcare power of attorney and living will documents the day of surgery if you haven't scanned them before.              Please read over the following fact sheets you were given: IF YOU HAVE QUESTIONS ABOUT YOUR PRE-OP  INSTRUCTIONS PLEASE CALL [phone] [facility]   If you received a COVID test during your pre-op visit  it is requested that you wear a mask when out in public, stay away from anyone that may not be feeling well and notify your surgeon if you develop symptoms. If you test positive for Covid or have been in contact with anyone that has tested positive in the last 10 days please notify you surgeon.      Pre-operative 5 CHG Bath Instructions   You can play a key role in reducing the risk of infection after surgery. Your skin needs to be as free of germs as possible. You can reduce the number of germs on your skin by washing with CHG (chlorhexidine gluconate) soap before surgery. CHG is an antiseptic soap that kills germs and continues to kill germs even after washing.   DO NOT use if you have an allergy to chlorhexidine/CHG or antibacterial soaps. If your skin becomes reddened or irritated, stop using the CHG and notify one of our RNs at [phone].   Please shower with the CHG soap starting 4 days before surgery using the following schedule:     Please keep in mind the following:  DO NOT shave, including legs and underarms, starting the day of your first shower.   You may shave your face at any point before/day of surgery.  Place clean sheets on your bed the day you start using CHG soap. Use a clean washcloth (not used since being washed) for each shower. DO NOT sleep with pets once you start using the CHG.   CHG Shower Instructions:  If you choose to wash your  hair and private area, wash first with your normal shampoo/soap.  After you use shampoo/soap, rinse your hair and body thoroughly to remove shampoo/soap residue.  Turn the water OFF and apply about 3 tablespoons (45 ml) of CHG soap to a CLEAN washcloth.  Apply CHG soap ONLY FROM YOUR NECK DOWN TO YOUR TOES (washing for 3-5 minutes)  DO NOT use CHG soap on face, private areas, open wounds, or sores.  Pay special attention to the  area where your surgery is being performed.  If you are having back surgery, having someone wash your back for you may be helpful. Wait 2 minutes after CHG soap is applied, then you may rinse off the CHG soap.  Pat dry with a clean towel  Put on clean clothes/pajamas   If you choose to wear lotion, please use ONLY the CHG-compatible lotions on the back of this paper.     Additional instructions for the day of surgery: DO NOT APPLY any lotions, deodorants, cologne, or perfumes.   Put on clean/comfortable clothes.  Brush your teeth.  Ask your nurse before applying any prescription medications to the skin.      CHG Compatible Lotions   Aveeno Moisturizing lotion  Cetaphil Moisturizing Cream  Cetaphil Moisturizing Lotion  Clairol Herbal Essence Moisturizing Lotion, Dry Skin  Clairol Herbal Essence Moisturizing Lotion, Extra Dry Skin  Clairol Herbal Essence Moisturizing Lotion, Normal Skin  Curel Age Defying Therapeutic Moisturizing Lotion with Alpha Hydroxy  Curel Extreme Care Body Lotion  Curel Soothing Hands Moisturizing Hand Lotion  Curel Therapeutic Moisturizing Cream, Fragrance-Free  Curel Therapeutic Moisturizing Lotion, Fragrance-Free  Curel Therapeutic Moisturizing Lotion, Original Formula  Eucerin Daily Replenishing Lotion  Eucerin Dry Skin Therapy Plus Alpha Hydroxy Crme  Eucerin Dry Skin Therapy Plus Alpha Hydroxy Lotion  Eucerin Original Crme  Eucerin Original Lotion  Eucerin Plus Crme Eucerin Plus Lotion  Eucerin TriLipid Replenishing Lotion  Keri Anti-Bacterial Hand Lotion  Keri Deep Conditioning Original Lotion Dry Skin Formula Softly Scented  Keri Deep Conditioning Original Lotion, Fragrance Free Sensitive Skin Formula  Keri Lotion Fast Absorbing Fragrance Free Sensitive Skin Formula  Keri Lotion Fast Absorbing Softly Scented Dry Skin Formula  Keri Original Lotion  Keri Skin Renewal Lotion Keri Silky Smooth Lotion  Keri Silky Smooth Sensitive Skin Lotion   Nivea Body Creamy Conditioning Oil  Nivea Body Extra Enriched Lotion  Nivea Body Original Lotion  Nivea Body Sheer Moisturizing Lotion Nivea Crme  Nivea Skin Firming Lotion  NutraDerm 30 Skin Lotion  NutraDerm Skin Lotion  NutraDerm Therapeutic Skin Cream  NutraDerm Therapeutic Skin Lotion  ProShield Protective Hand Cream  Incentive Spirometer An incentive spirometer is a tool that can help keep your lungs clear and active. This tool measures how well you are filling your lungs with each breath. Taking long deep breaths may help reverse or decrease the chance of developing breathing (pulmonary) problems (especially infection) following: A long period of time when you are unable to move or be active. BEFORE THE PROCEDURE  If the spirometer includes an indicator to show your best effort, your nurse or respiratory therapist will set it to a desired goal. If possible, sit up straight or lean slightly forward. Try not to slouch. Hold the incentive spirometer in an upright position. INSTRUCTIONS FOR USE  Sit on the edge of your bed if possible, or sit up as far as you can in bed or on a chair. Hold the incentive spirometer in an upright position. Breathe out normally. Place the  mouthpiece in your mouth and seal your lips tightly around it. Breathe in slowly and as deeply as possible, raising the piston or the ball toward the top of the column. Hold your breath for 3-5 seconds or for as long as possible. Allow the piston or ball to fall to the bottom of the column. Remove the mouthpiece from your mouth and breathe out normally. Rest for a few seconds and repeat Steps 1 through 7 at least 10 times every 1-2 hours when you are awake. Take your time and take a few normal breaths between deep breaths. The spirometer may include an indicator to show your best effort. Use the indicator as a goal to work toward during each repetition. After each set of 10 deep breaths, practice coughing to be sure  your lungs are clear. If you have an incision (the cut made at the time of surgery), support your incision when coughing by placing a pillow or rolled up towels firmly against it. Once you are able to get out of bed, walk around indoors and cough well. You may stop using the incentive spirometer when instructed by your caregiver.  RISKS AND COMPLICATIONS Take your time so you do not get dizzy or light-headed. If you are in pain, you may need to take or ask for pain medication before doing incentive spirometry. It is harder to take a deep breath if you are having pain. AFTER USE Rest and breathe slowly and easily. It can be helpful to keep track of a log of your progress. Your caregiver can provide you with a simple table to help with this. If you are using the spirometer at home, follow these instructions: SEEK MEDICAL CARE IF:  You are having difficultly using the spirometer. You have trouble using the spirometer as often as instructed. Your pain medication is not giving enough relief while using the spirometer. You develop fever of 100.5 F (38.1 C) or higher. SEEK IMMEDIATE MEDICAL CARE IF:  You cough up bloody sputum that had not been present before. You develop fever of 102 F (38.9 C) or greater. You develop worsening pain at or near the incision site. MAKE SURE YOU:  Understand these instructions. Will watch your condition. Will get help right away if you are not doing well or get worse. Document Released: 12/13/2006 Document Revised: 10/25/2011 Document Reviewed: 02/13/2007 [facility] [employer] Patient Information 2014 [place], [place].

## 2023-11-18 ENCOUNTER — Encounter (HOSPITAL_COMMUNITY): Payer: Self-pay

## 2023-11-18 ENCOUNTER — Other Ambulatory Visit: Payer: Self-pay

## 2023-11-18 ENCOUNTER — Encounter (HOSPITAL_COMMUNITY)
Admission: RE | Admit: 2023-11-18 | Discharge: 2023-11-18 | Disposition: A | Discharge status: Home / Self Care | Source: Ambulatory Visit | Attending: Specialist | Admitting: Specialist

## 2023-11-18 VITALS — BP 113/76 | HR 84 | Temp 98.5°F | Resp 16 | Ht 65.75 in | Wt 132.0 lb

## 2023-11-18 DIAGNOSIS — Z01812 Encounter for preprocedural laboratory examination: Secondary | ICD-10-CM | POA: Diagnosis present

## 2023-11-18 DIAGNOSIS — Z01818 Encounter for other preprocedural examination: Secondary | ICD-10-CM

## 2023-11-18 HISTORY — DX: Hypothyroidism, unspecified: E03.9

## 2023-11-18 HISTORY — DX: Gastro-esophageal reflux disease without esophagitis: K21.9

## 2023-11-18 HISTORY — DX: Unspecified osteoarthritis, unspecified site: M19.90

## 2023-11-18 HISTORY — DX: Personal history of other diseases of the digestive system: Z87.19

## 2023-11-18 LAB — SURGICAL PCR SCREEN
MRSA, PCR: NEGATIVE
Staphylococcus aureus: NEGATIVE

## 2023-11-22 NOTE — Anesthesia Preprocedure Evaluation (Signed)
 Anesthesia Evaluation  Patient identified by MRN, date of birth, ID band Patient awake    Reviewed: Allergy & Precautions, NPO status , Patient's Chart, lab work & pertinent test results  History of Anesthesia Complications Negative for: history of anesthetic complications  Airway Mallampati: II  TM Distance: >3 FB Neck ROM: Full    Dental  (+) Missing,    Pulmonary neg pulmonary ROS   Pulmonary exam normal        Cardiovascular negative cardio ROS Normal cardiovascular exam     Neuro/Psych negative neurological ROS     GI/Hepatic Neg liver ROS, hiatal hernia,GERD  Controlled,,  Endo/Other  Hypothyroidism    Renal/GU negative Renal ROS     Musculoskeletal  (+) Arthritis ,    Abdominal   Peds  Hematology negative hematology ROS (+)   Anesthesia Other Findings Day of surgery medications reviewed with patient.  Reproductive/Obstetrics                              Anesthesia Physical Anesthesia Plan  ASA: 2  Anesthesia Plan: Spinal   Post-op Pain Management: Tylenol PO (pre-op)* and Regional block*   Induction:   PONV Risk Score and Plan: 3 and Treatment may vary due to age or medical condition, Ondansetron, Propofol infusion, Dexamethasone and Midazolam  Airway Management Planned: Natural Airway and Simple Face Mask  Additional Equipment: None  Intra-op Plan:   Post-operative Plan:   Informed Consent: I have reviewed the patients History and Physical, chart, labs and discussed the procedure including the risks, benefits and alternatives for the proposed anesthesia with the patient or authorized representative who has indicated his/her understanding and acceptance.       Plan Discussed with: CRNA  Anesthesia Plan Comments:         Anesthesia Quick Evaluation

## 2023-11-23 ENCOUNTER — Ambulatory Visit (HOSPITAL_COMMUNITY)
Admission: RE | Admit: 2023-11-23 | Discharge: 2023-11-24 | Disposition: A | Discharge status: Home / Self Care | Payer: Managed Care, Other (non HMO) | Attending: Specialist | Admitting: Specialist

## 2023-11-23 ENCOUNTER — Ambulatory Visit (HOSPITAL_COMMUNITY): Payer: Self-pay | Admitting: Anesthesiology

## 2023-11-23 ENCOUNTER — Other Ambulatory Visit: Payer: Self-pay

## 2023-11-23 ENCOUNTER — Encounter (HOSPITAL_COMMUNITY): Payer: Self-pay | Admitting: Specialist

## 2023-11-23 ENCOUNTER — Ambulatory Visit (HOSPITAL_COMMUNITY)

## 2023-11-23 ENCOUNTER — Encounter (HOSPITAL_COMMUNITY)
Admission: RE | Disposition: A | Discharge status: Home / Self Care | Payer: Self-pay | Source: Home / Self Care | Attending: Specialist

## 2023-11-23 DIAGNOSIS — E039 Hypothyroidism, unspecified: Secondary | ICD-10-CM | POA: Diagnosis not present

## 2023-11-23 DIAGNOSIS — K219 Gastro-esophageal reflux disease without esophagitis: Secondary | ICD-10-CM | POA: Diagnosis not present

## 2023-11-23 DIAGNOSIS — M1712 Unilateral primary osteoarthritis, left knee: Secondary | ICD-10-CM | POA: Diagnosis present

## 2023-11-23 DIAGNOSIS — M21062 Valgus deformity, not elsewhere classified, left knee: Secondary | ICD-10-CM | POA: Insufficient documentation

## 2023-11-23 DIAGNOSIS — K449 Diaphragmatic hernia without obstruction or gangrene: Secondary | ICD-10-CM | POA: Diagnosis not present

## 2023-11-23 DIAGNOSIS — R2689 Other abnormalities of gait and mobility: Secondary | ICD-10-CM | POA: Diagnosis not present

## 2023-11-23 DIAGNOSIS — M659 Unspecified synovitis and tenosynovitis, unspecified site: Secondary | ICD-10-CM | POA: Diagnosis not present

## 2023-11-23 HISTORY — PX: TOTAL KNEE ARTHROPLASTY: SHX125

## 2023-11-23 SURGERY — ARTHROPLASTY, KNEE, TOTAL
Anesthesia: Spinal | Site: Knee | Laterality: Left

## 2023-11-23 MED ORDER — LEVOTHYROXINE SODIUM 75 MCG PO TABS
150.0000 ug | ORAL_TABLET | Freq: Every day | ORAL | Status: DC
  Administered 2023-11-24: 150 ug via ORAL
  Filled 2023-11-23: qty 2

## 2023-11-23 MED ORDER — MIDAZOLAM HCL 2 MG/2ML IJ SOLN
INTRAMUSCULAR | Status: AC
  Filled 2023-11-23: qty 2

## 2023-11-23 MED ORDER — ACETAMINOPHEN 500 MG PO TABS
1000.0000 mg | ORAL_TABLET | Freq: Four times a day (QID) | ORAL | Status: AC
  Administered 2023-11-23 – 2023-11-24 (×3): 1000 mg via ORAL
  Filled 2023-11-23 (×4): qty 2

## 2023-11-23 MED ORDER — OXYCODONE HCL 5 MG PO TABS
10.0000 mg | ORAL_TABLET | ORAL | Status: DC | PRN
  Administered 2023-11-23 – 2023-11-24 (×6): 10 mg via ORAL
  Filled 2023-11-23 (×6): qty 2

## 2023-11-23 MED ORDER — METOCLOPRAMIDE HCL 5 MG PO TABS: 5.0000 mg | ORAL_TABLET | Freq: Three times a day (TID) | ORAL | Status: DC | PRN

## 2023-11-23 MED ORDER — METHOCARBAMOL 500 MG PO TABS
500.0000 mg | ORAL_TABLET | Freq: Four times a day (QID) | ORAL | Status: DC | PRN
  Administered 2023-11-24 (×2): 500 mg via ORAL
  Filled 2023-11-23 (×3): qty 1

## 2023-11-23 MED ORDER — FENTANYL CITRATE (PF) 100 MCG/2ML IJ SOLN
INTRAMUSCULAR | Status: AC
  Filled 2023-11-23: qty 2

## 2023-11-23 MED ORDER — CEFAZOLIN SODIUM-DEXTROSE 2-4 GM/100ML-% IV SOLN
2.0000 g | INTRAVENOUS | Status: AC
  Administered 2023-11-23: 2 g via INTRAVENOUS
  Filled 2023-11-23: qty 100

## 2023-11-23 MED ORDER — EPHEDRINE SULFATE-NACL 50-0.9 MG/10ML-% IV SOSY
PREFILLED_SYRINGE | INTRAVENOUS | Status: DC | PRN
  Administered 2023-11-23: 5 mg via INTRAVENOUS

## 2023-11-23 MED ORDER — RISAQUAD PO CAPS
1.0000 | ORAL_CAPSULE | Freq: Every day | ORAL | Status: DC
  Administered 2023-11-24: 1 via ORAL
  Filled 2023-11-23: qty 1

## 2023-11-23 MED ORDER — PROPOFOL 1000 MG/100ML IV EMUL
INTRAVENOUS | Status: AC
Start: 2023-11-23 — End: ?
  Filled 2023-11-23: qty 100

## 2023-11-23 MED ORDER — ONDANSETRON HCL 4 MG/2ML IJ SOLN: 4.0000 mg | Freq: Four times a day (QID) | INTRAMUSCULAR | Status: DC | PRN

## 2023-11-23 MED ORDER — FENTANYL CITRATE (PF) 100 MCG/2ML IJ SOLN
INTRAMUSCULAR | Status: DC | PRN
  Administered 2023-11-23 (×2): 50 ug via INTRAVENOUS

## 2023-11-23 MED ORDER — SODIUM CHLORIDE (PF) 0.9 % IJ SOLN
INTRAMUSCULAR | Status: AC
  Filled 2023-11-23: qty 50

## 2023-11-23 MED ORDER — TRANEXAMIC ACID-NACL 1000-0.7 MG/100ML-% IV SOLN
1000.0000 mg | INTRAVENOUS | Status: AC
  Administered 2023-11-23: 1000 mg via INTRAVENOUS
  Filled 2023-11-23: qty 100

## 2023-11-23 MED ORDER — METHOCARBAMOL 500 MG PO TABS: 500.0000 mg | ORAL_TABLET | Freq: Three times a day (TID) | ORAL | 1 refills | Status: AC | PRN

## 2023-11-23 MED ORDER — FENTANYL CITRATE PF 50 MCG/ML IJ SOSY
PREFILLED_SYRINGE | INTRAMUSCULAR | Status: AC
  Filled 2023-11-23: qty 2

## 2023-11-23 MED ORDER — ALUM & MAG HYDROXIDE-SIMETH 200-200-20 MG/5ML PO SUSP: 30.0000 mL | ORAL | Status: DC | PRN

## 2023-11-23 MED ORDER — BUPIVACAINE LIPOSOME 1.3 % IJ SUSP
INTRAMUSCULAR | Status: AC
  Filled 2023-11-23: qty 20

## 2023-11-23 MED ORDER — METOCLOPRAMIDE HCL 5 MG/ML IJ SOLN: 5.0000 mg | Freq: Three times a day (TID) | INTRAMUSCULAR | Status: DC | PRN

## 2023-11-23 MED ORDER — FENTANYL CITRATE PF 50 MCG/ML IJ SOSY
25.0000 ug | PREFILLED_SYRINGE | INTRAMUSCULAR | Status: DC | PRN
  Administered 2023-11-23 (×3): 50 ug via INTRAVENOUS

## 2023-11-23 MED ORDER — BISACODYL 5 MG PO TBEC: 5.0000 mg | DELAYED_RELEASE_TABLET | Freq: Every day | ORAL | Status: DC | PRN

## 2023-11-23 MED ORDER — MIDAZOLAM HCL 5 MG/5ML IJ SOLN
INTRAMUSCULAR | Status: DC | PRN
  Administered 2023-11-23 (×2): 1 mg via INTRAVENOUS

## 2023-11-23 MED ORDER — DEXAMETHASONE SODIUM PHOSPHATE 10 MG/ML IJ SOLN
INTRAMUSCULAR | Status: DC | PRN
  Administered 2023-11-23: 8 mg via INTRAVENOUS

## 2023-11-23 MED ORDER — OXYCODONE HCL 5 MG PO TABS
ORAL_TABLET | ORAL | Status: AC
  Administered 2023-11-23: 5 mg via ORAL
  Filled 2023-11-23: qty 1

## 2023-11-23 MED ORDER — PHENOL 1.4 % MT LIQD: 1.0000 | OROMUCOSAL | Status: DC | PRN

## 2023-11-23 MED ORDER — OXYCODONE HCL 5 MG PO TABS: 5.0000 mg | ORAL_TABLET | ORAL | 0 refills | Status: DC | PRN

## 2023-11-23 MED ORDER — CLONIDINE HCL (ANALGESIA) 100 MCG/ML EP SOLN
EPIDURAL | Status: DC | PRN
Start: 2023-11-23 — End: 2023-11-23
  Administered 2023-11-23: 100 ug

## 2023-11-23 MED ORDER — BUPIVACAINE IN DEXTROSE 0.75-8.25 % IT SOLN
INTRATHECAL | Status: DC | PRN
  Administered 2023-11-23: 1.6 mL via INTRATHECAL

## 2023-11-23 MED ORDER — PHENYLEPHRINE 80 MCG/ML (10ML) SYRINGE FOR IV PUSH (FOR BLOOD PRESSURE SUPPORT)
PREFILLED_SYRINGE | INTRAVENOUS | Status: DC | PRN
  Administered 2023-11-23: 160 ug via INTRAVENOUS
  Administered 2023-11-23: 80 ug via INTRAVENOUS
  Administered 2023-11-23: 160 ug via INTRAVENOUS

## 2023-11-23 MED ORDER — PROPOFOL 1000 MG/100ML IV EMUL
INTRAVENOUS | Status: AC
  Filled 2023-11-23: qty 100

## 2023-11-23 MED ORDER — CEFAZOLIN SODIUM-DEXTROSE 2-4 GM/100ML-% IV SOLN
2.0000 g | Freq: Four times a day (QID) | INTRAVENOUS | Status: AC
  Administered 2023-11-23 (×2): 2 g via INTRAVENOUS
  Filled 2023-11-23 (×2): qty 100

## 2023-11-23 MED ORDER — LACTATED RINGERS IV SOLN: INTRAVENOUS | Status: DC

## 2023-11-23 MED ORDER — 0.9 % SODIUM CHLORIDE (POUR BTL) OPTIME
TOPICAL | Status: DC | PRN
  Administered 2023-11-23: 1000 mL

## 2023-11-23 MED ORDER — ACETAMINOPHEN 500 MG PO TABS
1000.0000 mg | ORAL_TABLET | Freq: Once | ORAL | Status: AC
  Administered 2023-11-23: 1000 mg via ORAL
  Filled 2023-11-23: qty 2

## 2023-11-23 MED ORDER — BUPIVACAINE-EPINEPHRINE (PF) 0.5% -1:200000 IJ SOLN
INTRAMUSCULAR | Status: DC | PRN
  Administered 2023-11-23: 15 mL via PERINEURAL

## 2023-11-23 MED ORDER — METHOCARBAMOL 1000 MG/10ML IJ SOLN: 500.0000 mg | Freq: Four times a day (QID) | INTRAMUSCULAR | Status: DC | PRN

## 2023-11-23 MED ORDER — ONDANSETRON HCL 4 MG/2ML IJ SOLN
INTRAMUSCULAR | Status: DC | PRN
  Administered 2023-11-23: 4 mg via INTRAVENOUS

## 2023-11-23 MED ORDER — POLYETHYLENE GLYCOL 3350 17 G PO PACK: 17.0000 g | PACK | Freq: Every day | ORAL | 0 refills | Status: DC

## 2023-11-23 MED ORDER — PHENYLEPHRINE HCL-NACL 20-0.9 MG/250ML-% IV SOLN
INTRAVENOUS | Status: AC
Start: 2023-11-23 — End: 2023-11-23
  Filled 2023-11-23: qty 250

## 2023-11-23 MED ORDER — TRAMADOL HCL 50 MG PO TABS: 50.0000 mg | ORAL_TABLET | Freq: Four times a day (QID) | ORAL | Status: DC | PRN

## 2023-11-23 MED ORDER — HYDROMORPHONE HCL 1 MG/ML IJ SOLN: 0.5000 mg | INTRAMUSCULAR | Status: DC | PRN

## 2023-11-23 MED ORDER — OXYCODONE HCL 5 MG/5ML PO SOLN: 5.0000 mg | Freq: Once | ORAL | Status: AC | PRN

## 2023-11-23 MED ORDER — MENTHOL 3 MG MT LOZG: 1.0000 | LOZENGE | OROMUCOSAL | Status: DC | PRN

## 2023-11-23 MED ORDER — ASPIRIN 81 MG PO TBEC: 81.0000 mg | DELAYED_RELEASE_TABLET | Freq: Two times a day (BID) | ORAL | 1 refills | Status: DC

## 2023-11-23 MED ORDER — VALERIAN 250 MG PO CAPS: ORAL_CAPSULE | Freq: Every day | ORAL | Status: DC

## 2023-11-23 MED ORDER — SODIUM CHLORIDE 0.9 % IR SOLN
Status: DC | PRN
  Administered 2023-11-23 (×2): 1000 mL
  Administered 2023-11-23: 250 mL

## 2023-11-23 MED ORDER — CHLORHEXIDINE GLUCONATE 0.12 % MT SOLN
15.0000 mL | Freq: Once | OROMUCOSAL | Status: AC
  Administered 2023-11-23: 15 mL via OROMUCOSAL

## 2023-11-23 MED ORDER — ACETAMINOPHEN 325 MG PO TABS: 325.0000 mg | ORAL_TABLET | Freq: Four times a day (QID) | ORAL | Status: DC | PRN

## 2023-11-23 MED ORDER — DIPHENHYDRAMINE HCL 12.5 MG/5ML PO ELIX: 12.5000 mg | ORAL_SOLUTION | ORAL | Status: DC | PRN

## 2023-11-23 MED ORDER — ACETAMINOPHEN 10 MG/ML IV SOLN: 1000.0000 mg | INTRAVENOUS | Status: DC

## 2023-11-23 MED ORDER — ORAL CARE MOUTH RINSE: 15.0000 mL | Freq: Once | OROMUCOSAL | Status: AC

## 2023-11-23 MED ORDER — SODIUM CHLORIDE 0.9 % IV SOLN: INTRAVENOUS | Status: DC | PRN

## 2023-11-23 MED ORDER — FENTANYL CITRATE PF 50 MCG/ML IJ SOSY
PREFILLED_SYRINGE | INTRAMUSCULAR | Status: AC
  Filled 2023-11-23: qty 1

## 2023-11-23 MED ORDER — OXYCODONE HCL 5 MG PO TABS: 5.0000 mg | ORAL_TABLET | ORAL | Status: DC | PRN

## 2023-11-23 MED ORDER — POLYETHYLENE GLYCOL 3350 17 G PO PACK: 17.0000 g | PACK | Freq: Every day | ORAL | Status: DC | PRN

## 2023-11-23 MED ORDER — MAGNESIUM CITRATE PO SOLN: 1.0000 | Freq: Once | ORAL | Status: DC | PRN

## 2023-11-23 MED ORDER — ONDANSETRON HCL 4 MG/2ML IJ SOLN
INTRAMUSCULAR | Status: AC
  Filled 2023-11-23: qty 2

## 2023-11-23 MED ORDER — ONDANSETRON HCL 4 MG PO TABS: 4.0000 mg | ORAL_TABLET | Freq: Four times a day (QID) | ORAL | Status: DC | PRN

## 2023-11-23 MED ORDER — PROPOFOL 500 MG/50ML IV EMUL
INTRAVENOUS | Status: DC | PRN
  Administered 2023-11-23: 100 ug/kg/min via INTRAVENOUS

## 2023-11-23 MED ORDER — ASPIRIN 81 MG PO CHEW
81.0000 mg | CHEWABLE_TABLET | Freq: Two times a day (BID) | ORAL | Status: DC
  Administered 2023-11-24: 81 mg via ORAL
  Filled 2023-11-23: qty 1

## 2023-11-23 MED ORDER — B COMPLEX-C PO TABS: 1.0000 | ORAL_TABLET | Freq: Every day | ORAL | Status: DC

## 2023-11-23 MED ORDER — OXYCODONE HCL 5 MG PO TABS: 5.0000 mg | ORAL_TABLET | Freq: Once | ORAL | Status: AC | PRN

## 2023-11-23 MED ORDER — DROPERIDOL 2.5 MG/ML IJ SOLN: 0.6250 mg | Freq: Once | INTRAMUSCULAR | Status: DC | PRN

## 2023-11-23 MED ORDER — DOCUSATE SODIUM 100 MG PO CAPS
100.0000 mg | ORAL_CAPSULE | Freq: Two times a day (BID) | ORAL | Status: DC
  Administered 2023-11-23: 100 mg via ORAL
  Filled 2023-11-23: qty 1

## 2023-11-23 MED ORDER — DEXAMETHASONE SODIUM PHOSPHATE 10 MG/ML IJ SOLN
INTRAMUSCULAR | Status: AC
  Filled 2023-11-23: qty 1

## 2023-11-23 MED ORDER — BUPIVACAINE-EPINEPHRINE (PF) 0.25% -1:200000 IJ SOLN
INTRAMUSCULAR | Status: AC
  Filled 2023-11-23: qty 30

## 2023-11-23 MED ORDER — KCL IN DEXTROSE-NACL 20-5-0.45 MEQ/L-%-% IV SOLN
INTRAVENOUS | Status: AC
  Filled 2023-11-23 (×2): qty 1000

## 2023-11-23 MED ORDER — DOCUSATE SODIUM 100 MG PO CAPS: 100.0000 mg | ORAL_CAPSULE | Freq: Two times a day (BID) | ORAL | 2 refills | Status: DC

## 2023-11-23 SURGICAL SUPPLY — 65 items
ATTUNE MED DOME PAT 32 KNEE (Knees) IMPLANT
ATTUNE PS FEM LT SZ 4 CEM KNEE (Femur) IMPLANT
ATTUNE PSRP INSR SZ4 8 KNEE (Insert) IMPLANT
BAG COUNTER SPONGE SURGICOUNT (BAG) IMPLANT
BAG ZIPLOCK 12X15 (MISCELLANEOUS) IMPLANT
BASEPLATE TIBIAL ROTATING SZ 4 (Knees) IMPLANT
BLADE SAW SGTL 11.0X1.19X90.0M (BLADE) ×1 IMPLANT
BLADE SAW SGTL 13.0X1.19X90.0M (BLADE) ×1 IMPLANT
BLADE SURG SZ10 CARB STEEL (BLADE) ×2 IMPLANT
BNDG ELASTIC 4INX 5YD STR LF (GAUZE/BANDAGES/DRESSINGS) ×1 IMPLANT
BNDG ELASTIC 6INX 5YD STR LF (GAUZE/BANDAGES/DRESSINGS) ×1 IMPLANT
BOWL SMART MIX CTS (DISPOSABLE) ×1 IMPLANT
CEMENT HV SMART SET (Cement) ×2 IMPLANT
CNTNR URN SCR LID CUP LEK RST (MISCELLANEOUS) IMPLANT
COVER SURGICAL LIGHT HANDLE (MISCELLANEOUS) ×1 IMPLANT
CUFF TRNQT CYL 34X4.125X (TOURNIQUET CUFF) ×1 IMPLANT
DRAPE INCISE IOBAN 66X45 STRL (DRAPES) IMPLANT
DRAPE SHEET LG 3/4 BI-LAMINATE (DRAPES) ×1 IMPLANT
DRAPE SURG ORHT 6 SPLT 77X108 (DRAPES) ×2 IMPLANT
DRAPE TOP 10253 STERILE (DRAPES) ×1 IMPLANT
DRAPE U-SHAPE 47X51 STRL (DRAPES) ×1 IMPLANT
DRSG AQUACEL AG ADV 3.5X10 (GAUZE/BANDAGES/DRESSINGS) ×1 IMPLANT
DURAPREP 26ML APPLICATOR (WOUND CARE) ×1 IMPLANT
ELECT BLADE TIP CTD 4 INCH (ELECTRODE) ×1 IMPLANT
ELECT PENCIL ROCKER SW 15FT (MISCELLANEOUS) ×1 IMPLANT
ELECT REM PT RETURN 15FT ADLT (MISCELLANEOUS) ×1 IMPLANT
EVACUATOR 1/8 PVC DRAIN (DRAIN) IMPLANT
GLOVE BIO SURGEON STRL SZ7 (GLOVE) ×1 IMPLANT
GLOVE BIOGEL PI IND STRL 7.0 (GLOVE) ×1 IMPLANT
GLOVE BIOGEL PI IND STRL 8 (GLOVE) ×1 IMPLANT
GLOVE SURG SS PI 8.0 STRL IVOR (GLOVE) ×1 IMPLANT
GOWN STRL REUS W/ TWL XL LVL3 (GOWN DISPOSABLE) ×2 IMPLANT
HEMOSTAT SPONGE AVITENE ULTRA (HEMOSTASIS) IMPLANT
HOLDER FOLEY CATH W/STRAP (MISCELLANEOUS) ×1 IMPLANT
IMMOBILIZER KNEE 20 (SOFTGOODS) ×1 IMPLANT
IMMOBILIZER KNEE 20 THIGH 36 (SOFTGOODS) ×1 IMPLANT
KIT TURNOVER KIT A (KITS) IMPLANT
MANIFOLD NEPTUNE II (INSTRUMENTS) ×1 IMPLANT
NS IRRIG 1000ML POUR BTL (IV SOLUTION) IMPLANT
PACK TOTAL KNEE CUSTOM (KITS) ×1 IMPLANT
PIN STEINMAN FIXATION KNEE (PIN) IMPLANT
PROTECTOR NERVE ULNAR (MISCELLANEOUS) ×1 IMPLANT
SAW OSC TIP CART 19.5X105X1.3 (SAW) IMPLANT
SEALER BIPOLAR AQUA 6.0 (INSTRUMENTS) IMPLANT
SET HNDPC FAN SPRY TIP SCT (DISPOSABLE) ×1 IMPLANT
SOLUTION PRONTOSAN WOUND 350ML (IRRIGATION / IRRIGATOR) ×1 IMPLANT
SPIKE FLUID TRANSFER (MISCELLANEOUS) ×1 IMPLANT
STAPLER VISISTAT (STAPLE) IMPLANT
STRIP CLOSURE SKIN 1/2X4 (GAUZE/BANDAGES/DRESSINGS) IMPLANT
SUT BONE WAX W31G (SUTURE) ×1 IMPLANT
SUT MNCRL AB 3-0 PS2 18 (SUTURE) IMPLANT
SUT MNCRL AB 4-0 PS2 18 (SUTURE) IMPLANT
SUT STRATAFIX 0 PDS 27 VIOLET (SUTURE) ×1 IMPLANT
SUT VIC AB 0 CT1 36 (SUTURE) ×1 IMPLANT
SUT VIC AB 1 CT1 27XBRD ANTBC (SUTURE) ×3 IMPLANT
SUT VIC AB 1 CT1 36 (SUTURE) IMPLANT
SUT VIC AB 2-0 CT1 TAPERPNT 27 (SUTURE) ×3 IMPLANT
SUTURE STRATFX 0 PDS 27 VIOLET (SUTURE) ×1 IMPLANT
SYR 30ML LL (SYRINGE) IMPLANT
TRAY FOLEY MTR SLVR 14FR STAT (SET/KITS/TRAYS/PACK) IMPLANT
TRAY FOLEY MTR SLVR 16FR STAT (SET/KITS/TRAYS/PACK) ×1 IMPLANT
TUBE SUCTION HIGH CAP CLEAR NV (SUCTIONS) ×1 IMPLANT
WATER STERILE IRR 1000ML POUR (IV SOLUTION) ×1 IMPLANT
WIPE CHG 2% PREP (PERSONAL CARE ITEMS) ×1 IMPLANT
WRAP KNEE MAXI GEL POST OP (GAUZE/BANDAGES/DRESSINGS) ×1 IMPLANT

## 2023-11-23 NOTE — Anesthesia Procedure Notes (Signed)
 Spinal  Patient location during procedure: OR Start time: 11/23/2023 8:30 AM End time: 11/23/2023 8:33 AM Reason for block: surgical anesthesia Staffing Performed: anesthesiologist  Anesthesiologist: Kaylyn Layer, MD Performed by: Kaylyn Layer, MD Authorized by: Kaylyn Layer, MD   Preanesthetic Checklist Completed: patient identified, IV checked, risks and benefits discussed, surgical consent, monitors and equipment checked, pre-op evaluation and timeout performed Spinal Block Patient position: sitting Prep: DuraPrep and site prepped and draped Patient monitoring: continuous pulse ox, blood pressure and heart rate Approach: midline Location: L3-4 Injection technique: single-shot Needle Needle type: Pencan  Needle gauge: 24 G Needle length: 9 cm Assessment Events: CSF return Additional Notes Risks, benefits, and alternative discussed. Patient gave consent to procedure. Prepped and draped in sitting position. Patient sedated but responsive to voice. Clear CSF obtained after one needle redirection. Positive terminal aspiration. No pain or paraesthesias with injection. Patient tolerated procedure well. Vital signs stable. Amalia Greenhouse, MD

## 2023-11-23 NOTE — Anesthesia Postprocedure Evaluation (Signed)
 Anesthesia Post Note  Patient: Leah Cuevas  Procedure(s) Performed: ARTHROPLASTY, KNEE, TOTAL (Left: Knee)     Patient location during evaluation: PACU Anesthesia Type: Spinal and General Level of consciousness: awake and alert Pain management: pain level controlled Vital Signs Assessment: post-procedure vital signs reviewed and stable Respiratory status: spontaneous breathing, nonlabored ventilation and respiratory function stable Cardiovascular status: blood pressure returned to baseline Postop Assessment: no apparent nausea or vomiting Anesthetic complications: no   No notable events documented.  Last Vitals:  Vitals:   11/23/23 1210 11/23/23 1227  BP: 96/64 102/64  Pulse: (!) 57 (!) 51  Resp: 16 18  Temp:  36.5 C  SpO2: 100% 100%    Last Pain:  Vitals:   11/23/23 1242  TempSrc:   PainSc: 4                  Shanda Howells

## 2023-11-23 NOTE — Discharge Instructions (Signed)
 Elevate leg above heart 6x a day for each Use knee immobilizer while walking until can SLR x 10 Use knee immobilizer in bed to keep knee in extension Aquacel dressing may remain in place for 1 week. May shower with aquacel dressing in place. If the dressing becomes saturated or peels off, you may remove aquacel dressing. Do not remove steri-strips if they are present. Place new dressing with gauze and tape or ACE bandage which should be kept clean and dry and changed daily.  INSTRUCTIONS AFTER JOINT REPLACEMENT   Remove items at home which could result in a fall. This includes throw rugs or furniture in walking pathways ICE to the affected joint every three hours while awake for 30 minutes at a time, for at least the first 3-5 days, and then as needed for pain and swelling.  Continue to use ice for pain and swelling. You may notice swelling that will progress down to the foot and ankle.  This is normal after surgery.  Elevate your leg when you are not up walking on it.   Continue to use the breathing machine you got in the hospital (incentive spirometer) which will help keep your temperature down.  It is common for your temperature to cycle up and down following surgery, especially at night when you are not up moving around and exerting yourself.  The breathing machine keeps your lungs expanded and your temperature down.   DIET:  As you were doing prior to hospitalization, we recommend a well-balanced diet.  DRESSING / WOUND CARE / SHOWERING  You may change your dressing 7 days after surgery.  Then change the dressing every day with sterile gauze.  Please use good hand washing techniques before changing the dressing.  Do not use any lotions or creams on the incision until instructed by your surgeon.  ACTIVITY  Increase activity slowly as tolerated, but follow the weight bearing instructions below.   No driving for 6 weeks or until further direction given by your physician.  You cannot  drive while taking narcotics.  No lifting or carrying greater than 10 lbs. until further directed by your surgeon. Avoid periods of inactivity such as sitting longer than an hour when not asleep. This helps prevent blood clots.  You may return to work once you are authorized by your doctor.     WEIGHT BEARING   Weight bearing as tolerated with assist device (walker, cane, etc) as directed, use it as long as suggested by your surgeon or therapist, typically at least 4-6 weeks.   EXERCISES  Results after joint replacement surgery are often greatly improved when you follow the exercise, range of motion and muscle strengthening exercises prescribed by your doctor. Safety measures are also important to protect the joint from further injury. Any time any of these exercises cause you to have increased pain or swelling, decrease what you are doing until you are comfortable again and then slowly increase them. If you have problems or questions, call your caregiver or physical therapist for advice.   Rehabilitation is important following a joint replacement. After just a few days of immobilization, the muscles of the leg can become weakened and shrink (atrophy).  These exercises are designed to build up the tone and strength of the thigh and leg muscles and to improve motion. Often times heat used for twenty to thirty minutes before working out will loosen up your tissues and help with improving the range of motion but do not use heat for  the first two weeks following surgery (sometimes heat can increase post-operative swelling).   These exercises can be done on a training (exercise) mat, on the floor, on a table or on a bed. Use whatever works the best and is most comfortable for you.    Use music or television while you are exercising so that the exercises are a pleasant break in your day. This will make your life better with the exercises acting as a break in your routine that you can look forward to.    Perform all exercises about fifteen times, three times per day or as directed.  You should exercise both the operative leg and the other leg as well.  Exercises include:   Quad Sets - Tighten up the muscle on the front of the thigh (Quad) and hold for 5-10 seconds.   Straight Leg Raises - With your knee straight (if you were given a brace, keep it on), lift the leg to 60 degrees, hold for 3 seconds, and slowly lower the leg.  Perform this exercise against resistance later as your leg gets stronger.  Leg Slides: Lying on your back, slowly slide your foot toward your buttocks, bending your knee up off the floor (only go as far as is comfortable). Then slowly slide your foot back down until your leg is flat on the floor again.  Angel Wings: Lying on your back spread your legs to the side as far apart as you can without causing discomfort.  Hamstring Strength:  Lying on your back, push your heel against the floor with your leg straight by tightening up the muscles of your buttocks.  Repeat, but this time bend your knee to a comfortable angle, and push your heel against the floor.  You may put a pillow under the heel to make it more comfortable if necessary.   A rehabilitation program following joint replacement surgery can speed recovery and prevent re-injury in the future due to weakened muscles. Contact your doctor or a physical therapist for more information on knee rehabilitation.    CONSTIPATION  Constipation is defined medically as fewer than three stools per week and severe constipation as less than one stool per week.  Even if you have a regular bowel pattern at home, your normal regimen is likely to be disrupted due to multiple reasons following surgery.  Combination of anesthesia, postoperative narcotics, change in appetite and fluid intake all can affect your bowels.   YOU MUST use at least one of the following options; they are listed in order of increasing strength to get the job done.   They are all available over the counter, and you may need to use some, POSSIBLY even all of these options:    Drink plenty of fluids (prune juice may be helpful) and high fiber foods Colace 100 mg by mouth twice a day  Senokot for constipation as directed and as needed Dulcolax (bisacodyl), take with full glass of water  Miralax (polyethylene glycol) once or twice a day as needed.  If you have tried all these things and are unable to have a bowel movement in the first 3-4 days after surgery call either your surgeon or your primary doctor.    If you experience loose stools or diarrhea, hold the medications until you stool forms back up.  If your symptoms do not get better within 1 week or if they get worse, check with your doctor.  If you experience "the worst abdominal pain ever" or develop nausea  or vomiting, please contact the office immediately for further recommendations for treatment.   ITCHING:  If you experience itching with your medications, try taking only a single pain pill, or even half a pain pill at a time.  You can also use Benadryl over the counter for itching or also to help with sleep.   TED HOSE STOCKINGS:  Use stockings on both legs until for at least 2 weeks or as directed by physician office. They may be removed at night for sleeping.  MEDICATIONS:  See your medication summary on the "After Visit Summary" that nursing will review with you.  You may have some home medications which will be placed on hold until you complete the course of blood thinner medication.  It is important for you to complete the blood thinner medication as prescribed.  PRECAUTIONS:  If you experience chest pain or shortness of breath - call 911 immediately for transfer to the hospital emergency department.   If you develop a fever greater that 101 F, purulent drainage from wound, increased redness or drainage from wound, foul odor from the wound/dressing, or calf pain - CONTACT YOUR SURGEON.                                                    FOLLOW-UP APPOINTMENTS:  If you do not already have a post-op appointment, please call the office for an appointment to be seen by your surgeon.  Guidelines for how soon to be seen are listed in your "After Visit Summary", but are typically between 1-4 weeks after surgery.  OTHER INSTRUCTIONS:   Knee Replacement:  Do not place pillow under knee, focus on keeping the knee straight while resting. CPM instructions: 0-90 degrees, 2 hours in the morning, 2 hours in the afternoon, and 2 hours in the evening. Place foam block, curve side up under heel at all times except when in CPM or when walking.  DO NOT modify, tear, cut, or change the foam block in any way.  POST-OPERATIVE OPIOID TAPER INSTRUCTIONS: It is important to wean off of your opioid medication as soon as possible. If you do not need pain medication after your surgery it is ok to stop day one. Opioids include: Codeine, Hydrocodone(Norco, Vicodin), Oxycodone(Percocet, oxycontin) and hydromorphone amongst others.  Long term and even short term use of opiods can cause: Increased pain response Dependence Constipation Depression Respiratory depression And more.  Withdrawal symptoms can include Flu like symptoms Nausea, vomiting And more Techniques to manage these symptoms Hydrate well Eat regular healthy meals Stay active Use relaxation techniques(deep breathing, meditating, yoga) Do Not substitute Alcohol to help with tapering If you have been on opioids for less than two weeks and do not have pain than it is ok to stop all together.  Plan to wean off of opioids This plan should start within one week post op of your joint replacement. Maintain the same interval or time between taking each dose and first decrease the dose.  Cut the total daily intake of opioids by one tablet each day Next start to increase the time between doses. The last dose that should be eliminated is the evening dose.    MAKE SURE YOU:  Understand these instructions.  Get help right away if you are not doing well or get worse.    Thank you for letting  us be a part of your medical care team.  It is a privilege we respect greatly.  We hope these instructions will help you stay on track for a fast and full recovery!

## 2023-11-23 NOTE — Interval H&P Note (Signed)
 History and Physical Interval Note:  11/23/2023 8:16 AM  Leah Cuevas  has presented today for surgery, with the diagnosis of Degenerative joint disease left knee.  The various methods of treatment have been discussed with the patient and family. After consideration of risks, benefits and other options for treatment, the patient has consented to  Procedure(s): ARTHROPLASTY, KNEE, TOTAL (Left) as a surgical intervention.  The patient's history has been reviewed, patient examined, no change in status, stable for surgery.  I have reviewed the patient's chart and labs.  Questions were answered to the patient's satisfaction.     Javier Docker

## 2023-11-23 NOTE — Plan of Care (Signed)
  Problem: Education: Goal: Knowledge of General Education information will improve Description: Including pain rating scale, medication(s)/side effects and non-pharmacologic comfort measures Outcome: Progressing   Problem: Health Behavior/Discharge Planning: Goal: Ability to manage health-related needs will improve Outcome: Progressing   Problem: Clinical Measurements: Goal: Ability to maintain clinical measurements within normal limits will improve Outcome: Progressing Goal: Will remain free from infection Outcome: Progressing Goal: Diagnostic test results will improve Outcome: Progressing Goal: Respiratory complications will improve Outcome: Progressing Goal: Cardiovascular complication will be avoided Outcome: Progressing   Problem: Activity: Goal: Risk for activity intolerance will decrease Outcome: Adequate for Discharge   Problem: Nutrition: Goal: Adequate nutrition will be maintained Outcome: Completed/Met   Problem: Coping: Goal: Level of anxiety will decrease Outcome: Progressing   Problem: Elimination: Goal: Will not experience complications related to bowel motility Outcome: Progressing Goal: Will not experience complications related to urinary retention Outcome: Progressing   Problem: Pain Managment: Goal: General experience of comfort will improve and/or be controlled Outcome: Progressing   Problem: Safety: Goal: Ability to remain free from injury will improve Outcome: Progressing   Problem: Skin Integrity: Goal: Risk for impaired skin integrity will decrease Outcome: Progressing   Problem: Education: Goal: Knowledge of the prescribed therapeutic regimen will improve Outcome: Progressing Goal: Individualized Educational Video(s) Outcome: Completed/Met   Problem: Activity: Goal: Ability to avoid complications of mobility impairment will improve Outcome: Progressing Goal: Range of joint motion will improve Outcome: Adequate for Discharge    Problem: Pain Management: Goal: Pain level will decrease with appropriate interventions Outcome: Adequate for Discharge   Problem: Skin Integrity: Goal: Will show signs of wound healing Outcome: Progressing

## 2023-11-23 NOTE — Brief Op Note (Signed)
 11/23/2023  8:17 AM  PATIENT:  Leah Cuevas  63 y.o. female  PRE-OPERATIVE DIAGNOSIS:  Degenerative joint disease left knee  POST-OPERATIVE DIAGNOSIS:  Degenerative joint disease left knee  PROCEDURE:  Procedure(s): ARTHROPLASTY, KNEE, TOTAL (Left)  SURGEON:  Surgeons and Role:    Jene Every, MD - Primary The aquamantis was utilized for this case to help facilitate better hemostasis as patient was felt to be at increased risk of bleeding because of history of coagulopathy.  PHYSICIAN ASSISTANT:   ASSISTANTS: Bissell   ANESTHESIA:   spinal  EBL:  50     BLOOD ADMINISTERED:none  DRAINS: none   LOCAL MEDICATIONS USED:  MARCAINE     SPECIMEN:  synovium    DISPOSITION OF SPECIMEN:  N/A  COUNTS:  YES  TOURNIQUET: 65 min  DICTATION: .Other Dictation: Dictation Number [id number]  PLAN OF CARE: Admit for overnight observation  PATIENT DISPOSITION:  PACU - hemodynamically stable.   Delay start of Pharmacological VTE agent (>24hrs) due to surgical blood loss or risk of bleeding: no

## 2023-11-23 NOTE — Anesthesia Procedure Notes (Signed)
 Anesthesia Regional Block: Adductor canal block   Pre-Anesthetic Checklist: , timeout performed,  Correct Patient, Correct Site, Correct Laterality,  Correct Procedure, Correct Position, site marked,  Risks and benefits discussed,  Pre-op evaluation,  At surgeon's request and post-op pain management  Laterality: Left  Prep: Maximum Sterile Barrier Precautions used, chloraprep       Needles:  Injection technique: Single-shot  Needle Type: Echogenic Stimulator Needle     Needle Length: 9cm  Needle Gauge: 22     Additional Needles:   Procedures:,,,, ultrasound used (permanent image in chart),,    Narrative:  Start time: 11/23/2023 8:05 AM End time: 11/23/2023 8:08 AM Injection made incrementally with aspirations every 5 mL.  Performed by: Personally  Anesthesiologist: Kaylyn Layer, MD  Additional Notes: Risks, benefits, and alternative discussed. Patient gave consent for procedure. Patient prepped and draped in sterile fashion. Sedation administered, patient remains easily responsive to voice. Relevant anatomy identified with ultrasound guidance. Local anesthetic given in 5cc increments with no signs or symptoms of intravascular injection. No pain or paraesthesias with injection. Patient monitored throughout procedure with signs of LAST or immediate complications. Tolerated well. Ultrasound image placed in chart.  Amalia Greenhouse, MD

## 2023-11-23 NOTE — Op Note (Signed)
 Leah Cuevas, CASALI MEDICAL RECORD NO: [id number] ACCOUNT NO: [id number] DATE OF BIRTH: [birth year] FACILITY: [facility] LOCATION: [id number] PHYSICIAN: Javier Docker, MD  Operative Report   DATE OF PROCEDURE: 11/23/2023  PREOPERATIVE DIAGNOSIS:  End-stage osteoarthrosis, valgus deformity, left knee.  POSTOPERATIVE DIAGNOSES:  End-stage osteoarthrosis, valgus deformity, left knee.  PROCEDURE PERFORMED:  Left total knee arthroplasty utilizing Depuy Attune rotating platform, 4 femur, 4 tibia, 8 mm insert, 30 patella.  ANESTHESIA:  Spinal converted to general.  ASSISTANT:  Lanna Poche, PA.  HISTORY:  A 63 year old with end-stage osteoarthrosis and varus deformity of the knee indicated for replacement of the degenerated joint, failing conservative treatment.  She had a type 1 valgus deformity.  Risks and benefits were discussed including  bleeding, infection, damage to neurovascular structures, DVT, PE, anesthetic complications, etc.  DESCRIPTION OF PROCEDURE:  The patient was placed in the supine position after induction of adequate general anesthesia preceded by unsuccessful spinal.  The left lower extremity was prepped, draped, and exsanguinated in the usual sterile fashion.  Thigh  tourniquet inflated to 225 mmHg.  A midline incision was made over the knee.  Full-thickness flaps were developed.  Median parapatellar arthrotomy was then performed, elevated minimally soft tissues medially, preserving the MCL.  The patella was gently  everted.  The fat pad was debrided.  The knee was gently flexed.  Tricompartmental osteoarthrosis particularly at the posterolateral aspect of the tibial plateau.  There was not a hypoplastic femoral condyle noted nor significant erosion of the patella.   The patient had bloody synovial fluid that was evacuated, and there was synovium discoloration consistent with hemosiderin deposits.  I used a Gaffer and performed a synovial biopsy and sent that to  pathology.  We performed a full synovectomy.   Medial and lateral menisci were removed as were remnants of the ACL.  A notch was placed above the femoral notch as a starting hole for the femoral drill, which was drilled in line with the femur, entering the canal.  This was irrigated T-handle  confirming the intramedullary canal.  I used an intramedullary guide 6-degree due to the valgus deformity, 8 off the distal femur as there was no flexion contracture.  This was pinned.  I performed a distal femoral cut.  I then subluxed the tibia,  preserving the popliteus, external alignment guide 2 off the defect, which was posterolaterally, bisecting tibiotalar joint parallel to the shaft 3-degree slope.  This was pinned.  This turned out to be 6 mm off the non-worn side which was medially.  I  performed our tibial cut and tried an extension spacer, which at a 6 was slightly hyperextended.  A 7 was a better fit.  I reflexed the knee, size the femur off the anterior cortex to be a 4, pinned in 3 degrees of external rotation.  I placed our distal  femoral block, performed our anterior and posterior chamfer cuts without notch in the femur.  Typical piano sign was noted on the femur.  This was protecting the soft tissues medially and laterally at all times.  Subluxed the tibia, sized it to a 4 just  to the medial third of the patellar tendon.  Maximizing her coverage, this was pinned, harvested bone centrally and impacted into the distal femur.  Drilled centrally, placed our punch fin guide.  Turned attention back to the femur and performed a box  cut after a jig bisecting the condyles and flushed with the anterior surface was pinned.  I  performed a box cut and then rasped the box cut.  Placed our trial femur.  It sat flush.  I placed a 7 mm insert.  The patient had full extension, full flexion,  good stability to varus and valgus stress at 0 to 30 degrees.  Negative anterior drawer.  Perhaps maybe the slight  hyperextension.  I tried 8 and that was optimal.  All trials were then removed.  We checked posteriorly.  The capsule was intact as was the  popliteus, cauterized the geniculates.  We used a curved Crego posteriorly behind the tibia medially and laterally.  There was synovial fluid from the Baker's cyst that was expressed and copiously irrigated.  Next, prior to removing the trials, we  everted the patella and measured to a 24 planned to a 15 utilizing patellar jig.  I then used a trial paddle, 32, parallel to the joint surface, drilled our peg holes, placed the trial patella and had reduced it and had excellent patellofemoral tracking.   Then all trials were removed.  Pulsatile lavage was used in the joint.  I cleaned all surfaces.  The knee was then flexed, mixed cement on the back table under vacuum centrifuge.  Cement was then placed in the proximal tibia digitally pressurizing it.   Cement was placed in the tibial tray and then impacted into place with redundant cement removed.  Cement was placed in the femoral component and in the femur, impacted into place and sit flush.  I placed an 80 insert and then reduced the knee and held  it in extension with axial load throughout the curing of the cement.  Prior to placing our permanent components, we used Exparel through the posteromedial capsule, aspirating first without breaking the vacuum and injecting 10 mL.  I anesthetized the  medial and lateral menisci, the periosteum of the femur, quadriceps, proximal tibial, plateau, etc.  I placed Marcaine with epinephrine in the wound for hemostasis followed by Prontosan and then covered the wound during the curing of the cement.  This  occurred at 65 minutes.  The tourniquet was deflated.  We had used Aquamantys for cautery.  He had full flexion and full extension.  Good stability to varus and valgus stress at 0 and 30 degrees.  Negative anterior drawer.  I removed the trial.   Meticulously removed all redundant  cement.  Cauterized the geniculates again, copiously irrigated with pulsatile lavage followed by Prontosan.  I selected an 8 permanent insert, reduced the knee and again had full extension, full flexion, good stability  to varus and valgus stress at 0 and 30 degrees.  Negative anterior drawer.  I then performed a lateral release.  Then in mid flexion, I reapproximated the patellar arthrotomy with 1 Vicryl interrupted figure-of-eight sutures oversewn with a running  Stratafix, copiously irrigated once again with Prontosan, multiple 2-0 closures, and then subcuticular Monocryl.  The patient had excellent patellofemoral tracking following this and flexion to gravity at 90 degrees.  Sterile dressing was applied, placed  in a mobilizer, extubated, and transported to the recovery room in good satisfactory condition.  The patient tolerated the procedure well.  No complications.  Assistant, Jaqueline Bissell, PA was used throughout the case for patient positioning,  traction, and closure.  SPECIMEN:  Synovium with hemosiderin deposit sent to Pathology.   PUS D: 11/23/2023 11:01:41 am T: 11/23/2023 5:02:00 pm  JOB: [id number]/ [id number]

## 2023-11-23 NOTE — Anesthesia Procedure Notes (Signed)
 Procedure Name: LMA Insertion Date/Time: 11/23/2023 9:10 AM  Performed by: Solae Norling D, CRNAPre-anesthesia Checklist: Patient identified, Emergency Drugs available, Suction available and Patient being monitored Patient Re-evaluated:Patient Re-evaluated prior to induction Oxygen Delivery Method: Circle system utilized Preoxygenation: Pre-oxygenation with 100% oxygen Induction Type: IV induction Ventilation: Mask ventilation without difficulty LMA: LMA inserted LMA Size: 4.0 Tube type: Oral Number of attempts: 1 Placement Confirmation: positive ETCO2 and breath sounds checked- equal and bilateral Tube secured with: Tape Dental Injury: Teeth and Oropharynx as per pre-operative assessment

## 2023-11-23 NOTE — Evaluation (Signed)
 Physical Therapy Evaluation Patient Details Name: Leah Cuevas MRN: [id number] DOB: March 02, 1961 Today's Date: 11/23/2023  History of Present Illness  Pt is 63 yo female admitted on 11/23/23 for L TKA.  Pt with hx including but not limited to arthritis, anemia  Clinical Impression  Pt is s/p TKA resulting in the deficits listed below (see PT Problem List). At baseline, pt independent and active.  She has home support and accessible home.  She will need a youth RW at d/c.  Today, pt with good quad activation, fair pain control, and motivated to get OOB.  She required CGA for safety and was able to ambulate 65' with RW and CGA.  Pt expected to progress very well with therapy. Pt will benefit from acute skilled PT to increase their independence and safety with mobility to allow discharge.          If plan is discharge home, recommend the following: A little help with walking and/or transfers;A little help with bathing/dressing/bathroom;Assistance with cooking/housework;Help with stairs or ramp for entrance   Can travel by private vehicle        Equipment Recommendations Other (comment) (YOUTH RW)  Recommendations for Other Services       Functional Status Assessment Patient has had a recent decline in their functional status and demonstrates the ability to make significant improvements in function in a reasonable and predictable amount of time.     Precautions / Restrictions Precautions Precautions: Fall;Knee Required Braces or Orthoses: Knee Immobilizer - Left Knee Immobilizer - Left: Discontinue once straight leg raise with < 10 degree lag Restrictions Weight Bearing Restrictions Per Provider Order: Yes LLE Weight Bearing Per Provider Order: Weight bearing as tolerated      Mobility  Bed Mobility Overal bed mobility: Needs Assistance Bed Mobility: Supine to Sit     Supine to sit: Contact guard          Transfers Overall transfer level: Needs assistance Equipment  used: Rolling walker (2 wheels) Transfers: Sit to/from Stand Sit to Stand: Contact guard assist           General transfer comment: cues for hand placement and L LE management    Ambulation/Gait Ambulation/Gait assistance: Contact guard assist Gait Distance (Feet): 50 Feet Assistive device: Rolling walker (2 wheels) Gait Pattern/deviations: Step-to pattern Gait velocity: decreased but functional     General Gait Details: Educated on step to pattern, RW proximity and pushing RW.  Pt tolerating well.  Reports she could feel it want to buckle with turns but PT did not note any major buckling  Stairs            Wheelchair Mobility     Tilt Bed    Modified Rankin (Stroke Patients Only)       Balance Overall balance assessment: Needs assistance Sitting-balance support: No upper extremity supported Sitting balance-Leahy Scale: Good     Standing balance support: Bilateral upper extremity supported, Reliant on assistive device for balance Standing balance-Leahy Scale: Poor Standing balance comment: steady wtih RW                             Pertinent Vitals/Pain Pain Assessment Pain Assessment: 0-10 Pain Score: 4  Pain Location: L knee, L plantar foot Pain Descriptors / Indicators: Discomfort Pain Intervention(s): Limited activity within patient's tolerance, Monitored during session, Premedicated before session, Repositioned, Ice applied (adjusted ACE wrap - pt reports had bunionectomy in past and plantar foot sensitive.  She felt that ACE too tight on foot.  PT noted ACE wrap velcro also on bottom of foot.  Rewrapped ACE starting wtih velcro on top.  Pt reports good improvement)    Home Living Family/patient expects to be discharged to:: Private residence Living Arrangements: Spouse/significant other;Children Available Help at Discharge: Family;Available 24 hours/day Type of Home: House Home Access: Stairs to enter Entrance Stairs-Rails: Right;Left  (too wide to reach both) Entrance Stairs-Number of Steps: 5   Home Layout: One level Home Equipment: None      Prior Function Prior Level of Function : Independent/Modified Independent;Driving             Mobility Comments: ambulated in community; liked to go to gym ADLs Comments: independent adls and iadls     Extremity/Trunk Assessment   Upper Extremity Assessment Upper Extremity Assessment: Overall WFL for tasks assessed    Lower Extremity Assessment Lower Extremity Assessment: LLE deficits/detail;RLE deficits/detail RLE Deficits / Details: ROM WFL; MMT 5/5 RLE Sensation: WNL LLE Deficits / Details: Expected post op changes; ROM knee 3 to 85 degrees; MMT: ankle 5/5, knee and hip 3/5 not further tested; able to SLR without ext lag LLE Sensation: WNL    Cervical / Trunk Assessment Cervical / Trunk Assessment: Normal  Communication        Cognition Arousal: Alert Behavior During Therapy: WFL for tasks assessed/performed   PT - Cognitive impairments: No apparent impairments                                 Cueing       General Comments      Exercises     Assessment/Plan    PT Assessment Patient needs continued PT services  PT Problem List Decreased strength;Decreased range of motion;Decreased activity tolerance;Decreased balance;Decreased mobility;Pain;Decreased knowledge of use of DME       PT Treatment Interventions DME instruction;Therapeutic exercise;Gait training;Balance training;Functional mobility training;Therapeutic activities;Patient/family education;Modalities;Stair training    PT Goals (Current goals can be found in the Care Plan section)  Acute Rehab PT Goals Patient Stated Goal: return home; be able to get up off floor after exercises (discussed this would be several weeks away) PT Goal Formulation: With patient Time For Goal Achievement: 12/07/23 Potential to Achieve Goals: Good    Frequency 7X/week     Co-evaluation                AM-PAC PT "6 Clicks" Mobility  Outcome Measure Help needed turning from your back to your side while in a flat bed without using bedrails?: A Little Help needed moving from lying on your back to sitting on the side of a flat bed without using bedrails?: A Little Help needed moving to and from a bed to a chair (including a wheelchair)?: A Little Help needed standing up from a chair using your arms (e.g., wheelchair or bedside chair)?: A Little Help needed to walk in hospital room?: A Little Help needed climbing 3-5 steps with a railing? : A Little 6 Click Score: 18    End of Session Equipment Utilized During Treatment: Gait belt Activity Tolerance: Patient tolerated treatment well Patient left: with chair alarm set;in chair;with call bell/phone within reach;with SCD's reapplied Nurse Communication: Mobility status PT Visit Diagnosis: Other abnormalities of gait and mobility (R26.89);Muscle weakness (generalized) (M62.81)    Time: 2952-8413 PT Time Calculation (min) (ACUTE ONLY): 28 min   Charges:   PT Evaluation $PT  Eval Low Complexity: 1 Low PT Treatments $Gait Training: 8-22 mins PT General Charges $$ ACUTE PT VISIT: 1 Visit         Anise Salvo, PT Acute Rehab [facility] [phone]   Rayetta Humphrey 11/23/2023, 4:24 PM

## 2023-11-23 NOTE — Transfer of Care (Signed)
 Immediate Anesthesia Transfer of Care Note  Patient: Leah Cuevas  Procedure(s) Performed: ARTHROPLASTY, KNEE, TOTAL (Left: Knee)  Patient Location: PACU  Anesthesia Type:General, Regional, and Spinal  Level of Consciousness: awake, alert , and oriented  Airway & Oxygen Therapy: Patient Spontanous Breathing and Patient connected to face mask oxygen  Post-op Assessment: Report given to RN and Post -op Vital signs reviewed and stable  Post vital signs: Reviewed and stable  Last Vitals:  Vitals Value Taken Time  BP 118/72 11/23/23 1112  Temp    Pulse 51 11/23/23 1114  Resp 11 11/23/23 1114  SpO2 100 % 11/23/23 1114  Vitals shown include unfiled device data.  Last Pain:  Vitals:   11/23/23 0647  TempSrc:   PainSc: 4          Complications: No notable events documented.

## 2023-11-24 ENCOUNTER — Encounter (HOSPITAL_COMMUNITY): Payer: Self-pay | Admitting: Specialist

## 2023-11-24 DIAGNOSIS — M1712 Unilateral primary osteoarthritis, left knee: Secondary | ICD-10-CM | POA: Diagnosis not present

## 2023-11-24 LAB — BASIC METABOLIC PANEL WITH GFR
Anion gap: 6 (ref 5–15)
BUN: 13 mg/dL (ref 8–23)
CO2: 27 mmol/L (ref 22–32)
Calcium: 8.4 mg/dL — ABNORMAL LOW (ref 8.9–10.3)
Chloride: 104 mmol/L (ref 98–111)
Creatinine, Ser: 0.85 mg/dL (ref 0.44–1.00)
GFR, Estimated: >60 mL/min (ref >60)
Glucose, Bld: 130 mg/dL — ABNORMAL HIGH (ref 70–99)
Potassium: 3.9 mmol/L (ref 3.5–5.1)
Sodium: 137 mmol/L (ref 135–145)

## 2023-11-24 LAB — CBC
HCT: 35 % — ABNORMAL LOW (ref 36.0–46.0)
Hemoglobin: 10.8 g/dL — ABNORMAL LOW (ref 12.0–15.0)
MCH: 25.7 pg — ABNORMAL LOW (ref 26.0–34.0)
MCHC: 30.9 g/dL (ref 30.0–36.0)
MCV: 83.1 fL (ref 80.0–100.0)
Platelets: 161 10*3/uL (ref 150–400)
RBC: 4.21 MIL/uL (ref 3.87–5.11)
RDW: 15.2 % (ref 11.5–15.5)
WBC: 10.6 10*3/uL — ABNORMAL HIGH (ref 4.0–10.5)
nRBC: 0 % (ref 0.0–0.2)

## 2023-11-24 LAB — SURGICAL PATHOLOGY

## 2023-11-24 NOTE — Progress Notes (Signed)
 Physical Therapy Treatment Patient Details Name: Leah Cuevas MRN: [id number] DOB: 24-Jul-1961 Today's Date: 11/24/2023   History of Present Illness Pt is 63 yo female admitted on 11/23/23 for L TKA.  Pt with hx including but not limited to arthritis, anemia    PT Comments  Pt progressing very well. Amb  and reviewed stairs with pt and spouse. Will see again in pm to reviewed HEP. Pt will be ready for d/c later today    If plan is discharge home, recommend the following: A little help with walking and/or transfers;A little help with bathing/dressing/bathroom;Assistance with cooking/housework;Help with stairs or ramp for entrance   Can travel by private vehicle        Equipment Recommendations   (delivered)    Recommendations for Other Services       Precautions / Restrictions Precautions Precautions: Fall;Knee Recall of Precautions/Restrictions: Intact Knee Immobilizer - Left: Discontinue once straight leg raise with < 10 degree lag Restrictions Weight Bearing Restrictions Per Provider Order: No LLE Weight Bearing Per Provider Order: Weight bearing as tolerated     Mobility  Bed Mobility Overal bed mobility: Needs Assistance Bed Mobility: Supine to Sit     Supine to sit: Supervision     General bed mobility comments: for safety    Transfers Overall transfer level: Needs assistance Equipment used: Rolling walker (2 wheels) Transfers: Sit to/from Stand Sit to Stand: Supervision, Contact guard assist           General transfer comment: cues for hand placement and L LE management    Ambulation/Gait Ambulation/Gait assistance: Contact guard assist, Supervision Gait Distance (Feet): 120 Feet Assistive device: Rolling walker (2 wheels) Gait Pattern/deviations: Step-to pattern, Step-through pattern Gait velocity: decreased but functional     General Gait Details: cues for sequence, RW position. pt  progressing to step through pattern end of  distance   Stairs Stairs: Yes Stairs assistance: Contact guard assist Stair Management: One rail Right, Step to pattern Number of Stairs: 3 General stair comments: cues for sequence and technique; good stability, no knee buckling. spouse present for session and able to provide assist   Wheelchair Mobility     Tilt Bed    Modified Rankin (Stroke Patients Only)       Balance                                            Communication Communication Communication: No apparent difficulties  Cognition Arousal: Alert Behavior During Therapy: WFL for tasks assessed/performed   PT - Cognitive impairments: No apparent impairments                         Following commands: Intact      Cueing Cueing Techniques: Verbal cues  Exercises      General Comments        Pertinent Vitals/Pain Pain Assessment Pain Assessment: 0-10 Pain Score: 3  Pain Location: L knee Pain Descriptors / Indicators: Discomfort Pain Intervention(s): Limited activity within patient's tolerance, Monitored during session, Premedicated before session, Repositioned    Home Living                          Prior Function            PT Goals (current goals can now be found in the care  plan section) Acute Rehab PT Goals Patient Stated Goal: return home; be able to get up off floor after exercises (discussed this would be several weeks away) PT Goal Formulation: With patient Time For Goal Achievement: 12/07/23 Potential to Achieve Goals: Good Progress towards PT goals: Progressing toward goals    Frequency    7X/week      PT Plan      Co-evaluation              AM-PAC PT "6 Clicks" Mobility   Outcome Measure  Help needed turning from your back to your side while in a flat bed without using bedrails?: A Little Help needed moving from lying on your back to sitting on the side of a flat bed without using bedrails?: A Little Help needed moving to  and from a bed to a chair (including a wheelchair)?: A Little Help needed standing up from a chair using your arms (e.g., wheelchair or bedside chair)?: A Little Help needed to walk in hospital room?: A Little Help needed climbing 3-5 steps with a railing? : A Little 6 Click Score: 18    End of Session Equipment Utilized During Treatment: Gait belt Activity Tolerance: Patient tolerated treatment well Patient left: with chair alarm set;in chair;with call bell/phone within reach;with family/visitor present Nurse Communication: Mobility status PT Visit Diagnosis: Other abnormalities of gait and mobility (R26.89);Muscle weakness (generalized) (M62.81)     Time: 5784-6962 PT Time Calculation (min) (ACUTE ONLY): 24 min  Charges:    $Gait Training: 23-37 mins PT General Charges $$ ACUTE PT VISIT: 1 Visit                     Roseland Braun, PT  Acute Rehab Dept [facility]) [phone]  11/24/2023    [facility] 11/24/2023, 11:10 AM

## 2023-11-24 NOTE — Progress Notes (Signed)
 PT TX NOTE  11/24/23 1400  PT Visit Information  Last PT Received On 11/24/23  Assistance Needed Reviewed TKA HEP, elevation, knee precautions and use of ice;  questions encouraged to answered. Pt husband present for session.  Pt is ready to d/c from pt standpoint with spouse assisting as needed   History of Present Illness Pt is 63 yo female admitted on 11/23/23 for L TKA.  Pt with hx including but not limited to arthritis, anemia  Subjective Data  Patient Stated Goal return home; be able to get up off floor after exercises (discussed this would be several weeks away)  Precautions  Precautions Fall;Knee  Recall of Precautions/Restrictions Intact  Knee Immobilizer - Left Discontinue once straight leg raise with < 10 degree lag  Restrictions  Weight Bearing Restrictions Per Provider Order No  LLE Weight Bearing Per Provider Order WBAT  Pain Assessment  Pain Assessment 0-10  Pain Score 7  Pain Location L knee with activity  Pain Descriptors / Indicators Discomfort  Cognition  Arousal Alert  Behavior During Therapy WFL for tasks assessed/performed  PT - Cognitive impairments No apparent impairments  Following Commands  Following commands Intact  Cueing  Cueing Techniques Verbal cues  Communication  Communication No apparent difficulties  Total Joint Exercises  Ankle Circles/Pumps AROM;Both;10 reps  Quad Sets AROM;Both;5 reps  Short Arc Quad AROM;Left;5 reps  Heel Slides AROM;AAROM;Left  Hip ABduction/ADduction AROM;Left;10 reps  Straight Leg Raises AROM;Left;10 reps;AAROM  Goniometric ROM grossly 8 to 65 degrees flexion  PT - End of Session  Equipment Utilized During Treatment Gait belt  Activity Tolerance Patient tolerated treatment well  Patient left with call bell/phone within reach;with family/visitor present;in bed;with bed alarm set  Nurse Communication Mobility status   PT - Assessment/Plan  PT Visit Diagnosis Other abnormalities of gait and mobility (R26.89);Muscle  weakness (generalized) (M62.81)  PT Frequency (ACUTE ONLY) 7X/week  Follow Up Recommendations Follow physician's recommendations for discharge plan and follow up therapies  Patient can return home with the following A little help with walking and/or transfers;A little help with bathing/dressing/bathroom;Assistance with cooking/housework;Help with stairs or ramp for entrance  PT equipment  (delivered)  AM-PAC PT "6 Clicks" Mobility Outcome Measure (Version 2)  Help needed turning from your back to your side while in a flat bed without using bedrails? 3  Help needed moving from lying on your back to sitting on the side of a flat bed without using bedrails? 3  Help needed moving to and from a bed to a chair (including a wheelchair)? 3  Help needed standing up from a chair using your arms (e.g., wheelchair or bedside chair)? 3  Help needed to walk in hospital room? 3  Help needed climbing 3-5 steps with a railing?  3  6 Click Score 18  Consider Recommendation of Discharge To: Home with [facility]  PT Goal Progression  Progress towards PT goals Progressing toward goals  Acute Rehab PT Goals  PT Goal Formulation With patient  Time For Goal Achievement 12/07/23  Potential to Achieve Goals Good  PT Time Calculation  PT Start Time (ACUTE ONLY) 1343  PT Stop Time (ACUTE ONLY) 1401  PT Time Calculation (min) (ACUTE ONLY) 18 min  PT General Charges  $$ ACUTE PT VISIT 1 Visit  PT Treatments  $Therapeutic Exercise 8-22 mins

## 2023-11-24 NOTE — TOC Transition Note (Signed)
 Transition of Care [facility]) - Discharge Note   Patient Details  Name: Leah Cuevas MRN: [id number] Date of Birth: [birth year]  Transition of [facility]) CM/SW Contact:  Howell Rucks, RN Phone Number: 11/24/2023, 9:27 AM   Clinical Narrative:  Met with pt at bedside to review dc therapy and home equipment needs, pt confirmed OPPT at [facility], RW delivered to room by [employer]. No TOC needs.      Final next level of care: OP Rehab Barriers to Discharge: No Barriers Identified   Patient Goals and CMS Choice Patient states their goals for this hospitalization and ongoing recovery are:: return home          Discharge Placement                       Discharge Plan and Services Additional resources added to the After Visit Summary for                                       Social Drivers of Health (SDOH) Interventions SDOH Screenings   Food Insecurity: No Food Insecurity (11/23/2023)  Housing: Low Risk  (11/23/2023)  Transportation Needs: No Transportation Needs (11/23/2023)  Utilities: Not At Risk (11/23/2023)  Alcohol Screen: Low Risk  (09/16/2022)  Depression (PHQ2-9): Low Risk  (09/16/2022)  Financial Resource Strain: Low Risk  (10/28/2023)   Received from [facility]  Physical Activity: Sufficiently Active (10/28/2023)   Received from [facility]  Social Connections: Moderately Integrated (10/28/2023)   Received from [facility]  Stress: No Stress Concern Present (10/28/2023)   Received from [facility]  Tobacco Use: Low Risk  (11/23/2023)     Readmission Risk Interventions     No data to display

## 2023-11-24 NOTE — Progress Notes (Signed)
 Subjective: 1 Day Post-Op Procedure(s) (LRB): ARTHROPLASTY, KNEE, TOTAL (Left) Patient reports pain as 4 on 0-10 scale.   Denies CP or SOB.  Voiding without difficulty. Positive flatus. Objective: Vital signs in last 24 hours: Temp:  [97.4 F (36.3 C)-98.6 F (37 C)] 98.5 F (36.9 C) (04/10 0640) Pulse Rate:  [44-76] 55 (04/10 0640) Resp:  [11-18] 18 (04/10 0640) BP: (86-125)/(53-73) 125/55 (04/10 0640) SpO2:  [98 %-100 %] 100 % (04/10 0640)  Intake/Output from previous day: 04/09 0701 - 04/10 0700 In: 2057 [I.V.:1857; IV Piggyback:200] Out: 3375 [Urine:3325; Blood:50] Intake/Output this shift: No intake/output data recorded.  Recent Labs    11/24/23 0316  HGB 10.8*   Recent Labs    11/24/23 0316  WBC 10.6*  RBC 4.21  HCT 35.0*  PLT 161   Recent Labs    11/24/23 0316  NA 137  K 3.9  CL 104  CO2 27  BUN 13  CREATININE 0.85  GLUCOSE 130*  CALCIUM 8.4*   No results for input(s): "LABPT", "INR" in the last 72 hours.  Neurologically intact Neurovascular intact Sensation intact distally Intact pulses distally Dorsiflexion/Plantar flexion intact Incision: dressing C/D/I Compartment soft No DVT  Assessment/Plan:  1 Day Post-Op Procedure(s) (LRB): ARTHROPLASTY, KNEE, TOTAL (Left) Advance diet Discharge home with home health   Principal Problem:   Left knee DJD      Leah Cuevas 11/24/2023, @[facility] 

## 2023-12-15 ENCOUNTER — Ambulatory Visit: Admitting: Adult Health

## 2024-01-25 ENCOUNTER — Ambulatory Visit: Admitting: Adult Health

## 2024-01-25 ENCOUNTER — Encounter: Payer: Self-pay | Admitting: Adult Health

## 2024-01-25 VITALS — BP 109/72 | HR 79 | Ht 65.0 in | Wt 128.5 lb

## 2024-01-25 DIAGNOSIS — Z1211 Encounter for screening for malignant neoplasm of colon: Secondary | ICD-10-CM | POA: Diagnosis not present

## 2024-01-25 DIAGNOSIS — Z01419 Encounter for gynecological examination (general) (routine) without abnormal findings: Secondary | ICD-10-CM

## 2024-01-25 DIAGNOSIS — Z1331 Encounter for screening for depression: Secondary | ICD-10-CM | POA: Diagnosis not present

## 2024-01-25 LAB — HEMOCCULT GUIAC POC 1CARD (OFFICE): Fecal Occult Blood, POC: NEGATIVE

## 2024-01-25 NOTE — Progress Notes (Signed)
 Patient ID: Leah Cuevas, female   DOB: Oct 30, 1960, 63 y.o.   MRN: [id number] History of Present Illness: [name] is a 64 year old white female,married, PM in for a GYN physical exam. She had left knee replacement 11/23/23 and is doing great.      Component Value Date/Time   DIAGPAP  09/16/2022 0843    - Negative for intraepithelial lesion or malignancy (NILM)   DIAGPAP - Atrophic vaginitis 09/16/2022 0843   DIAGPAP  06/21/2019 0840    - Negative for intraepithelial lesion or malignancy (NILM)   HPVHIGH Negative 09/16/2022 0843   HPVHIGH Positive (A) 06/21/2019 0840   ADEQPAP  09/16/2022 0843    Satisfactory for evaluation. The presence or absence of an   ADEQPAP  09/16/2022 0843    endocervical/transformation zone component cannot be determined because   ADEQPAP of atrophy. 09/16/2022 0843    PCP is Burnard Carrow  NP   Current Medications, Allergies, Past Medical History, Past Surgical History, Family History and Social History were reviewed in [employer] record.     Review of Systems: Patient denies any headaches, hearing loss, fatigue, blurred vision, shortness of breath, chest pain, abdominal pain, problems with bowel movements, urination, or intercourse(not active). No joint pain or mood swings.     Physical Exam:BP 109/72 (BP Location: Left Arm, Patient Position: Sitting, Cuff Size: Normal)   Pulse 79   Ht 5' 5 (1.651 m)   Wt 128 lb 8 oz (58.3 kg)   BMI 21.38 kg/m   General:  Well developed, well nourished, no acute distress Skin:  Warm and dry Neck:  Midline trachea, normal thyroid , good ROM, no lymphadenopathy, no carotid bruits heard  Lungs; Clear to auscultation bilaterally Breast:  No dominant palpable mass, retraction, or nipple discharge Cardiovascular: Regular rate and rhythm Abdomen:  Soft, non tender, no hepatosplenomegaly Pelvic:  External genitalia is normal in appearance, no lesions.  The vagina is pale.  Urethra has no lesions or  masses. The cervix is smooth.   Uterus is felt to be normal size, shape, and contour.  No adnexal masses or tenderness noted.Bladder is non tender, no masses felt. Rectal: Good sphincter tone, no polyps, or hemorrhoids felt.  Hemoccult negative. Extremities/musculoskeletal:  No swelling or varicosities noted, no clubbing or cyanosis Psych:  No mood changes, alert and cooperative,seems happy AA is 0 Fall risk is low    01/25/2024    3:52 PM 09/16/2022    8:36 AM 08/28/2020    8:39 AM  Depression screen PHQ 2/9  Decreased Interest 0 0 0  Down, Depressed, Hopeless 0 0 0  PHQ - 2 Score 0 0 0  Altered sleeping 1 1 0  Tired, decreased energy 1 1 0  Change in appetite 0 0 0  Feeling bad or failure about yourself  0 0 0  Trouble concentrating 0 0 0  Moving slowly or fidgety/restless 0 0 0  Suicidal thoughts 0 0 0  PHQ-9 Score 2 2 0  Difficult doing work/chores   Not difficult at all       01/25/2024    3:54 PM 09/16/2022    8:36 AM  GAD 7 : Generalized Anxiety Score  Nervous, Anxious, on Edge 0 0  Control/stop worrying 0 0  Worry too much - different things 0 0  Trouble relaxing 0 0  Restless 0 0  Easily annoyed or irritable 0 1  Afraid - awful might happen 0 0  Total GAD 7 Score 0  1    Upstream - 01/25/24 1551       Pregnancy Intention Screening   Does the patient want to become pregnant in the next year? N/A    Does the patient's partner want to become pregnant in the next year? N/A    Would the patient like to discuss contraceptive options today? N/A      Contraception Wrap Up   Current Method Abstinence   PM   End Method Abstinence   PM   Contraception Counseling Provided No              Examination chaperoned by Alphonso Aschoff LPN   Impression and plan: 1. Encounter for gynecological examination (Primary) GYN physical in 1 year Pap in 2027 Labs with PCP Colonoscopy per GI Mammogram was negative 06/10/23 Stay active   2. Encounter for screening fecal occult  blood testing Hemoccult was negative  - POCT occult blood stool

## 2024-05-28 ENCOUNTER — Other Ambulatory Visit (HOSPITAL_COMMUNITY): Payer: Self-pay | Admitting: Adult Health Nurse Practitioner

## 2024-05-28 DIAGNOSIS — Z1231 Encounter for screening mammogram for malignant neoplasm of breast: Secondary | ICD-10-CM

## 2024-06-11 ENCOUNTER — Encounter (HOSPITAL_COMMUNITY): Payer: Self-pay

## 2024-06-11 ENCOUNTER — Ambulatory Visit (HOSPITAL_COMMUNITY)
Admission: RE | Admit: 2024-06-11 | Discharge: 2024-06-11 | Disposition: A | Discharge status: Home / Self Care | Source: Ambulatory Visit | Attending: Adult Health Nurse Practitioner | Admitting: Adult Health Nurse Practitioner

## 2024-06-11 DIAGNOSIS — Z1231 Encounter for screening mammogram for malignant neoplasm of breast: Secondary | ICD-10-CM | POA: Insufficient documentation
